# Patient Record
Sex: Female | Born: 1990 | Race: White | Hispanic: No | Marital: Single | Smoking: Former smoker
Health system: Southern US, Community
[De-identification: ages and names within clinical notes are randomized; demographics above are authoritative.]

## PROBLEM LIST (undated history)

## (undated) DIAGNOSIS — D6851 Activated protein C resistance: Secondary | ICD-10-CM

## (undated) HISTORY — DX: Activated protein C resistance: D68.51

## (undated) HISTORY — PX: MOUTH SURGERY: SHX715

---
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
## 2011-09-22 ENCOUNTER — Encounter: Payer: Self-pay | Admitting: Anesthesiology

## 2011-09-29 ENCOUNTER — Ambulatory Visit (INDEPENDENT_AMBULATORY_CARE_PROVIDER_SITE_OTHER): Payer: Managed Care, Other (non HMO) | Admitting: Gynecology

## 2011-09-29 ENCOUNTER — Encounter: Payer: Self-pay | Admitting: Gynecology

## 2011-09-29 DIAGNOSIS — N946 Dysmenorrhea, unspecified: Secondary | ICD-10-CM

## 2011-09-29 DIAGNOSIS — R635 Abnormal weight gain: Secondary | ICD-10-CM

## 2011-09-29 DIAGNOSIS — R5381 Other malaise: Secondary | ICD-10-CM

## 2011-09-29 DIAGNOSIS — N920 Excessive and frequent menstruation with regular cycle: Secondary | ICD-10-CM

## 2011-09-29 DIAGNOSIS — R5383 Other fatigue: Secondary | ICD-10-CM

## 2011-09-29 NOTE — Patient Instructions (Signed)
Will cal you if labs are abnormal. Start birth control pill on second day of cycle.

## 2011-09-29 NOTE — Progress Notes (Signed)
20 y/o GoPo presented to office with mother today concerned about being "hormonaly imbalanced". Patient was tested in past and has Leiden V Factor (heterozygous). She had been placed on a progesterone low dose pill but did not take for long due to compliance issues. She would like to get back on it. She has c/o of weight gain and hungry a lot. She does have strong family history of diabetes. We will have her come to office later this week for fasting blood work: CMET, TSH,U/A and will check CBC as well. She will be re prescribed Micronor (progesterone only) OCP to help with her dysmenorrhea and menorrhagia and mood swings. Risk/benefits pros and cons discussed.   Exam: HEENT unremarkable Lungs CTA Heart: RRR, no murmur or gallops Neck: No carotid bruits  A: Dysmenorrhea/menorrhagia/PMDD  P: As per above

## 2011-09-30 ENCOUNTER — Ambulatory Visit (INDEPENDENT_AMBULATORY_CARE_PROVIDER_SITE_OTHER): Payer: Managed Care, Other (non HMO) | Admitting: *Deleted

## 2011-09-30 DIAGNOSIS — R82998 Other abnormal findings in urine: Secondary | ICD-10-CM

## 2011-09-30 DIAGNOSIS — R5383 Other fatigue: Secondary | ICD-10-CM

## 2011-09-30 DIAGNOSIS — N920 Excessive and frequent menstruation with regular cycle: Secondary | ICD-10-CM

## 2011-09-30 DIAGNOSIS — R635 Abnormal weight gain: Secondary | ICD-10-CM

## 2011-09-30 LAB — COMPREHENSIVE METABOLIC PANEL
Albumin: 4.4 g/dL (ref 3.5–5.2)
BUN: 19 mg/dL (ref 6–23)
Calcium: 9.5 mg/dL (ref 8.4–10.5)
Chloride: 109 mEq/L (ref 96–112)
Creat: 0.82 mg/dL (ref 0.50–1.10)
Glucose, Bld: 88 mg/dL (ref 70–99)
Potassium: 4.9 mEq/L (ref 3.5–5.3)

## 2012-02-19 ENCOUNTER — Encounter: Payer: Self-pay | Admitting: Gynecology

## 2012-02-19 ENCOUNTER — Ambulatory Visit (INDEPENDENT_AMBULATORY_CARE_PROVIDER_SITE_OTHER): Payer: Managed Care, Other (non HMO) | Admitting: Gynecology

## 2012-02-19 VITALS — BP 116/72

## 2012-02-19 DIAGNOSIS — L089 Local infection of the skin and subcutaneous tissue, unspecified: Secondary | ICD-10-CM

## 2012-02-19 DIAGNOSIS — L723 Sebaceous cyst: Secondary | ICD-10-CM

## 2012-02-19 MED ORDER — FLUCONAZOLE 100 MG PO TABS: 100.0000 mg | ORAL_TABLET | Freq: Every day | ORAL | Status: AC

## 2012-02-19 MED ORDER — DICLOXACILLIN SODIUM 250 MG PO CAPS: 500.0000 mg | ORAL_CAPSULE | Freq: Three times a day (TID) | ORAL | Status: AC

## 2012-02-19 NOTE — Progress Notes (Signed)
21 year old who presented to the office today complaining of a redness and irritation on the right side of her breast. She stated that she squeeze did when he came to a head and cheesy-like material with some blood extruded. She denied any recent anterior, to the area. She switch her bra still an athletic support during this time. She denied any fever chills. She had been applying Neosporin regularly recently.  Exam: Both breasts were examined in sitting and supine position. Both breasts are symmetrical in appearance. No skin discoloration on the left breast. Right breast small area with minimal erythema and some flocculence nontender area was noted lateral to the right breast and nontender. Contralateral breast no skin discoloration. There was no nipple inversion or skin retraction on either breast. There was no supraclavicular or axillary lymphadenopathy.  Assessment/plan: Infected sebaceous cyst. Patient will be placed on dicloxacillin 500 mg one by mouth 3 times a day for 10 days. She may continue to apply the Neosporin to 3 times a day. She'll return to the office in 2 weeks for followup.

## 2012-02-19 NOTE — Patient Instructions (Signed)
Epidermal Cyst An epidermal cyst is usually a small, painless lump under the skin. Cysts often occur on the face, neck, stomach, chest, or genitals. The cyst may be filled with a bad smelling paste. Do not pop your cyst. Popping the cyst can cause pain and puffiness (swelling). HOME CARE   Only take medicines as told by your doctor.   Take your medicine (antibiotics) as told. Finish it even if you start to feel better.  GET HELP RIGHT AWAY IF:  Your cyst is tender, red, or puffy.   You are not getting better, or you are getting worse.   You have any questions or concerns.  MAKE SURE YOU:  Understand these instructions.   Will watch your condition.   Will get help right away if you are not doing well or get worse.  Document Released: 01/15/2005 Document Revised: 08/20/2011 Document Reviewed: 06/16/2011 [employer] Patient Information 2012 [employer]. 

## 2012-03-03 ENCOUNTER — Ambulatory Visit: Payer: Managed Care, Other (non HMO) | Admitting: Gynecology

## 2012-03-09 ENCOUNTER — Ambulatory Visit (INDEPENDENT_AMBULATORY_CARE_PROVIDER_SITE_OTHER): Payer: Managed Care, Other (non HMO) | Admitting: Gynecology

## 2012-03-09 ENCOUNTER — Encounter: Payer: Self-pay | Admitting: Gynecology

## 2012-03-09 VITALS — BP 100/68

## 2012-03-09 DIAGNOSIS — N6089 Other benign mammary dysplasias of unspecified breast: Secondary | ICD-10-CM

## 2012-03-09 NOTE — Progress Notes (Signed)
Patient a 21 year old who was seen in the office on February 28 and she was found to have an infected sebaceous cyst of the right breast and was placed on dicloxacillin 500 mg 3 times a day for 10 days and application of Neosporin transdermally. Patient presented to the office today for followup.  Breast exam: Both breasts were examined sitting supine position there were symmetrical in appearance no skin discoloration no nipple inversion no palpable masses or supraclavicular axillary lymphadenopathy. The previous area on the right breast at approximately the 10:00 position were the sebaceous infected cyst was present has completely healed.  Patient was reassured and instructed to return to the office in one year or when necessary and to continue to do her monthly self breast examinations.

## 2013-01-25 ENCOUNTER — Other Ambulatory Visit (HOSPITAL_COMMUNITY)
Admission: RE | Admit: 2013-01-25 | Discharge: 2013-01-25 | Disposition: A | Discharge status: Home / Self Care | Payer: Managed Care, Other (non HMO) | Source: Ambulatory Visit | Attending: Gynecology | Admitting: Gynecology

## 2013-01-25 ENCOUNTER — Encounter: Payer: Self-pay | Admitting: Gynecology

## 2013-01-25 ENCOUNTER — Ambulatory Visit (INDEPENDENT_AMBULATORY_CARE_PROVIDER_SITE_OTHER): Payer: Managed Care, Other (non HMO) | Admitting: Gynecology

## 2013-01-25 VITALS — BP 118/70 | Ht 61.5 in | Wt 142.0 lb

## 2013-01-25 DIAGNOSIS — Z01419 Encounter for gynecological examination (general) (routine) without abnormal findings: Secondary | ICD-10-CM

## 2013-01-25 DIAGNOSIS — Z833 Family history of diabetes mellitus: Secondary | ICD-10-CM

## 2013-01-25 DIAGNOSIS — D6851 Activated protein C resistance: Secondary | ICD-10-CM

## 2013-01-25 DIAGNOSIS — D6859 Other primary thrombophilia: Secondary | ICD-10-CM

## 2013-01-25 DIAGNOSIS — R635 Abnormal weight gain: Secondary | ICD-10-CM | POA: Insufficient documentation

## 2013-01-25 DIAGNOSIS — Z113 Encounter for screening for infections with a predominantly sexual mode of transmission: Secondary | ICD-10-CM

## 2013-01-25 MED ORDER — NORETHINDRONE 0.35 MG PO TABS: 1.0000 | ORAL_TABLET | Freq: Every day | ORAL | Status: DC

## 2013-01-25 NOTE — Progress Notes (Signed)
Kaylee Collins 05-06-1991 [id number]   History:    22 y.o.  for annual gyn exam had run out of her oral contraceptive pill which was progesterone only (Camilla).Patient was tested in past and has Leiden V Factor (heterozygous). She has been using condoms for contraception. She has completed previously the Gardasil Vaccine series. She frequently does her self breast examination. She was complaining of weight gain today as well. She has a steady sexual partner. Patient with family history of diabetes.   Past medical history,surgical history, family history and social history were all reviewed and documented in the EPIC chart.  Gynecologic History Patient's last menstrual period was 12/30/2012. Contraception: condoms Last Pap: First Pap smear today. Results were: First Pap smear today Last mammogram: Not indicated. Results were: Not indicated  Obstetric History OB History    Grav Para Term Preterm Abortions TAB SAB Ect Mult Living   0                ROS: A ROS was performed and pertinent positives and negatives are included in the history.  GENERAL: No fevers or chills. HEENT: No change in vision, no earache, sore throat or sinus congestion. NECK: No pain or stiffness. CARDIOVASCULAR: No chest pain or pressure. No palpitations. PULMONARY: No shortness of breath, cough or wheeze. GASTROINTESTINAL: No abdominal pain, nausea, vomiting or diarrhea, melena or bright red blood per rectum. GENITOURINARY: No urinary frequency, urgency, hesitancy or dysuria. MUSCULOSKELETAL: No joint or muscle pain, no back pain, no recent trauma. DERMATOLOGIC: No rash, no itching, no lesions. ENDOCRINE: No polyuria, polydipsia, no heat or cold intolerance. No recent change in weight. HEMATOLOGICAL: No anemia or easy bruising or bleeding. NEUROLOGIC: No headache, seizures, numbness, tingling or weakness. PSYCHIATRIC: No depression, no loss of interest in normal activity or change in sleep pattern.     Exam: chaperone  present  BP 118/70  Ht 5' 1.5" (1.562 m)  Wt 142 lb (64.411 kg)  BMI 26.40 kg/m2  LMP 12/30/2012  Body mass index is 26.40 kg/(m^2).  General appearance : Well developed well nourished female. No acute distress HEENT: Neck supple, trachea midline, no carotid bruits, no thyroidmegaly Lungs: Clear to auscultation, no rhonchi or wheezes, or rib retractions  Heart: Regular rate and rhythm, no murmurs or gallops Breast:Examined in sitting and supine position were symmetrical in appearance, no palpable masses or tenderness,  no skin retraction, no nipple inversion, no nipple discharge, no skin discoloration, no axillary or supraclavicular lymphadenopathy Abdomen: no palpable masses or tenderness, no rebound or guarding Extremities: no edema or skin discoloration or tenderness  Pelvic:  Bartholin, Urethra, Skene Glands: Within normal limits             Vagina: No gross lesions or discharge  Cervix: No gross lesions or discharge  Uterus  anteverted, normal size, shape and consistency, non-tender and mobile  Adnexa  Without masses or tenderness  Anus and perineum  normal   Rectovaginal  normal sphincter tone without palpated masses or tenderness             Hemoccult not indicated   GC and Chlamydia culture obtained today  Assessment/Plan:  22 y.o. female for annual exam requesting refill on her Camilla oral contraceptive pill. Patient fully understands the risks benefits and pros and cons of oral contraceptive pills. She is heterozygous for factor V 5 Leiden. She was reminded to do her monthly self breast examination. The following labs were drawn today: CBC, urinalysis, TSH, hemoglobin A1c, and Pap  smear.     Ok Edwards MD, 5:15 PM 01/25/2013

## 2013-01-25 NOTE — Patient Instructions (Addendum)
Breast Self-Awareness  Practicing breast self-awareness may pick up problems early, prevent significant medical complications, and possibly save your life. By practicing breast self-awareness, you can become familiar with how your breasts look and feel and if your breasts are changing. This allows you to notice changes early. It can also offer you some reassurance that your breast health is good. One way to learn what is normal for your breasts and whether your breasts are changing is to do a breast self-exam.  If you find a lump or something that was not present in the past, it is best to contact your caregiver right away. Other findings that should be evaluated by your caregiver include nipple discharge, especially if it is bloody; skin changes or reddening; areas where the skin seems to be pulled in (retracted); or new lumps and bumps. Breast pain is seldom associated with cancer (malignancy), but should also be evaluated by a caregiver.  BREAST SELF-EXAM  The best time to examine your breasts is 5 7 days after your menstrual period is over. During menstruation, the breasts are lumpier, and it may be more difficult to pick up changes. If you do not menstruate, have reached menopause, or had your uterus removed (hysterectomy), you should examine your breasts at regular intervals, such as monthly. If you are breastfeeding, examine your breasts after a feeding or after using a breast pump. Breast implants do not decrease the risk for lumps or tumors, so continue to perform breast self-exams as recommended. Talk to your caregiver about how to determine the difference between the implant and breast tissue. Also, talk about the amount of pressure you should use during the exam. Over time, you will become more familiar with the variations of your breasts and more comfortable with the exam. A breast self-exam requires you to remove all your clothes above the waist.    Look at your breasts and nipples. Stand in front of  a mirror in a room with good lighting. With your hands on your hips, push your hands firmly downward. Look for a difference in shape, contour, and size from one breast to the other (asymmetry). Asymmetry includes puckers, dips, or bumps. Also, look for skin changes, such as reddened or scaly areas on the breasts. Look for nipple changes, such as discharge, dimpling, repositioning, or redness.   Carefully feel your breasts. This is best done either in the shower or tub while using soapy water or when flat on your back. Place the arm (on the side of the breast you are examining) above your head. Use the pads (not the fingertips) of your three middle fingers on your opposite hand to feel your breasts. Start in the underarm area and use  inch (2 cm) overlapping circles to feel your breast. Use 3 different levels of pressure (light, medium, and firm pressure) at each circle before moving to the next circle. The light pressure is needed to feel the tissue closest to the skin. The medium pressure will help to feel breast tissue a little deeper, while the firm pressure is needed to feel the tissue close to the ribs. Continue the overlapping circles, moving downward over the breast until you feel your ribs below your breast. Then, move one finger-width towards the center of the body. Continue to use the  inch (2 cm) overlapping circles to feel your breast as you move slowly up toward the collar bone (clavicle) near the base of the neck. Continue the up and down exam using all 3 pressures   the chest. Do this with each breast, carefully feeling for lumps or changes.  Keep a written record with breast changes or normal findings for each breast. By writing this information down, you do not need to depend only on memory for size, tenderness, or location. Write down where you are in your menstrual cycle, if you are still menstruating.  Breast tissue can have some lumps or thick tissue. However,  see your caregiver if you find anything that concerns you.  SEEK MEDICAL CARE IF:  You see a change in shape, contour, or size of your breasts or nipples.   You see skin changes, such as reddened or scaly areas on the breasts or nipples.   You have an unusual discharge from your nipples.   You feel a new lump or unusually thick areas.  Document Released: 12/08/2005 Document Revised: 06/08/2012 Document Reviewed: 03/24/2012 [facility] Patient Information 2013 [place], [place].  Intrauterine Device Information An intrauterine device (IUD) is inserted into your uterus and prevents pregnancy. There are 2 types of IUDs available:  Copper IUD. This type of IUD is wrapped in copper wire and is placed inside the uterus. Copper makes the uterus and fallopian tubes produce a fluid that kills sperm. The copper IUD can stay in place for 10 years.  Hormone IUD. This type of IUD contains the hormone progestin (synthetic progesterone). The hormone thickens the cervical mucus and prevents sperm from entering the uterus, and it also thins the uterine lining to prevent implantation of a fertilized egg. The hormone can weaken or kill the sperm that get into the uterus. The hormone IUD can stay in place for 5 years. Your caregiver will make sure you are a good candidate for a contraceptive IUD. Discuss with your caregiver the possible side effects. ADVANTAGES  It is highly effective, reversible, long-acting, and low maintenance.  There are no estrogen-related side effects.  An IUD can be used when breastfeeding.  It is not associated with weight gain.  It works immediately after insertion.  The copper IUD does not interfere with your female hormones.  The progesterone IUD can make heavy menstrual periods lighter.  The progesterone IUD can be used for 5 years.  The copper IUD can be used for 10 years. DISADVANTAGES  The progesterone IUD can be associated with irregular bleeding patterns.  The  copper IUD can make your menstrual flow heavier and more painful.  You may experience cramping and vaginal bleeding after insertion. Document Released: 11/11/2004 Document Revised: 03/01/2012 Document Reviewed: 04/12/2011 [facility] Patient Information 2013 [place], [place].

## 2013-01-26 ENCOUNTER — Other Ambulatory Visit: Payer: Managed Care, Other (non HMO)

## 2013-01-26 LAB — GC/CHLAMYDIA PROBE AMP
CT Probe RNA: NEGATIVE
GC Probe RNA: NEGATIVE

## 2013-01-27 ENCOUNTER — Encounter: Payer: Self-pay | Admitting: Gynecology

## 2013-01-27 LAB — URINALYSIS W MICROSCOPIC + REFLEX CULTURE
Bilirubin Urine: NEGATIVE
Casts: NONE SEEN
Crystals: NONE SEEN
Glucose, UA: NEGATIVE mg/dL
Hgb urine dipstick: NEGATIVE
Ketones, ur: NEGATIVE mg/dL
Leukocytes, UA: NEGATIVE
Protein, ur: NEGATIVE mg/dL
Specific Gravity, Urine: 1.025 (ref 1.005–1.030)
Squamous Epithelial / LPF: NONE SEEN
pH: 7.5 (ref 5.0–8.0)

## 2013-01-27 LAB — CBC WITH DIFFERENTIAL/PLATELET
Eosinophils Absolute: 0.1 10*3/uL (ref 0.0–0.7)
HCT: 37.8 % (ref 36.0–46.0)
Hemoglobin: 13.1 g/dL (ref 12.0–15.0)
Lymphocytes Relative: 49 % — ABNORMAL HIGH (ref 12–46)
Lymphs Abs: 3.4 10*3/uL (ref 0.7–4.0)
Monocytes Absolute: 0.3 10*3/uL (ref 0.1–1.0)
Monocytes Relative: 4 % (ref 3–12)
Neutro Abs: 3.1 10*3/uL (ref 1.7–7.7)
Neutrophils Relative %: 44 % (ref 43–77)
RBC: 4.34 MIL/uL (ref 3.87–5.11)
WBC: 6.9 10*3/uL (ref 4.0–10.5)

## 2013-01-27 LAB — HEMOGLOBIN A1C
Hgb A1c MFr Bld: 5.5 % (ref <5.7)
Mean Plasma Glucose: 111 mg/dL (ref <117)

## 2013-07-20 ENCOUNTER — Other Ambulatory Visit: Payer: Self-pay | Admitting: Family Medicine

## 2013-07-20 DIAGNOSIS — R109 Unspecified abdominal pain: Secondary | ICD-10-CM

## 2013-07-21 ENCOUNTER — Ambulatory Visit
Admission: RE | Admit: 2013-07-21 | Discharge: 2013-07-21 | Disposition: A | Discharge status: Home / Self Care | Payer: Managed Care, Other (non HMO) | Source: Ambulatory Visit | Attending: Family Medicine | Admitting: Family Medicine

## 2013-07-21 DIAGNOSIS — R109 Unspecified abdominal pain: Secondary | ICD-10-CM

## 2013-08-03 ENCOUNTER — Other Ambulatory Visit: Payer: Self-pay | Admitting: Gastroenterology

## 2013-08-03 DIAGNOSIS — R109 Unspecified abdominal pain: Secondary | ICD-10-CM

## 2013-08-03 DIAGNOSIS — R197 Diarrhea, unspecified: Secondary | ICD-10-CM

## 2013-08-11 ENCOUNTER — Ambulatory Visit
Admission: RE | Admit: 2013-08-11 | Discharge: 2013-08-11 | Disposition: A | Discharge status: Home / Self Care | Payer: Managed Care, Other (non HMO) | Source: Ambulatory Visit | Attending: Gastroenterology | Admitting: Gastroenterology

## 2013-08-11 ENCOUNTER — Other Ambulatory Visit: Payer: Self-pay | Admitting: Gastroenterology

## 2013-08-11 DIAGNOSIS — R197 Diarrhea, unspecified: Secondary | ICD-10-CM

## 2013-08-11 DIAGNOSIS — R109 Unspecified abdominal pain: Secondary | ICD-10-CM

## 2014-11-15 ENCOUNTER — Ambulatory Visit (INDEPENDENT_AMBULATORY_CARE_PROVIDER_SITE_OTHER): Payer: BC Managed Care – PPO | Admitting: Gynecology

## 2014-11-15 ENCOUNTER — Encounter: Payer: Self-pay | Admitting: Gynecology

## 2014-11-15 VITALS — BP 112/76 | Ht 61.5 in | Wt 177.0 lb

## 2014-11-15 DIAGNOSIS — R635 Abnormal weight gain: Secondary | ICD-10-CM

## 2014-11-15 DIAGNOSIS — D688 Other specified coagulation defects: Secondary | ICD-10-CM

## 2014-11-15 DIAGNOSIS — Z01419 Encounter for gynecological examination (general) (routine) without abnormal findings: Secondary | ICD-10-CM

## 2014-11-15 DIAGNOSIS — N946 Dysmenorrhea, unspecified: Secondary | ICD-10-CM | POA: Insufficient documentation

## 2014-11-15 DIAGNOSIS — N920 Excessive and frequent menstruation with regular cycle: Secondary | ICD-10-CM

## 2014-11-15 DIAGNOSIS — Z113 Encounter for screening for infections with a predominantly sexual mode of transmission: Secondary | ICD-10-CM

## 2014-11-15 DIAGNOSIS — D6851 Activated protein C resistance: Secondary | ICD-10-CM

## 2014-11-15 DIAGNOSIS — Z833 Family history of diabetes mellitus: Secondary | ICD-10-CM

## 2014-11-15 NOTE — Progress Notes (Signed)
Kaylee Collins 10/28/1991 [id number]   History:    23 y.o.  for annual gyn exam wanted discuss alternative forms of contraception. Patient is on a progesterone only oral contraceptive pill (Camilla) ).Patient was tested in past and has Leiden V Factor (heterozygous). She has been using condoms for contraception at times since she finished her oral contraceptive pill. She is suffering from heavy cycles and cramping and would like to look at an alternative such as an IUD. She also has gaining weight as well. She was weighing 142 pounds last year she is up to 177 pounds. She does have family history of diabetes. Patient declined flu vaccine.  Past medical history,surgical history, family history and social history were all reviewed and documented in the EPIC chart.  Gynecologic History Patient's last menstrual period was 10/25/2014. Contraception: none Last Pap: 2014. Results were: normal Last mammogram: Not indicated. Results were: Not indicated  Obstetric History OB History  Gravida Para Term Preterm AB SAB TAB Ectopic Multiple Living  0                  ROS: A ROS was performed and pertinent positives and negatives are included in the history.  GENERAL: No fevers or chills. HEENT: No change in vision, no earache, sore throat or sinus congestion. NECK: No pain or stiffness. CARDIOVASCULAR: No chest pain or pressure. No palpitations. PULMONARY: No shortness of breath, cough or wheeze. GASTROINTESTINAL: No abdominal pain, nausea, vomiting or diarrhea, melena or bright red blood per rectum. GENITOURINARY: No urinary frequency, urgency, hesitancy or dysuria. MUSCULOSKELETAL: No joint or muscle pain, no back pain, no recent trauma. DERMATOLOGIC: No rash, no itching, no lesions. ENDOCRINE: No polyuria, polydipsia, no heat or cold intolerance. No recent change in weight. HEMATOLOGICAL: No anemia or easy bruising or bleeding. NEUROLOGIC: No headache, seizures, numbness, tingling or weakness.  PSYCHIATRIC: No depression, no loss of interest in normal activity or change in sleep pattern.     Exam: chaperone present  BP 112/76 mmHg  Ht 5' 1.5" (1.562 m)  Wt 177 lb (80.287 kg)  BMI 32.91 kg/m2  LMP 10/25/2014  Body mass index is 32.91 kg/(m^2).  General appearance : Well developed well nourished female. No acute distress HEENT: Neck supple, trachea midline, no carotid bruits, no thyroidmegaly Lungs: Clear to auscultation, no rhonchi or wheezes, or rib retractions  Heart: Regular rate and rhythm, no murmurs or gallops Breast:Examined in sitting and supine position were symmetrical in appearance, no palpable masses or tenderness,  no skin retraction, no nipple inversion, no nipple discharge, no skin discoloration, no axillary or supraclavicular lymphadenopathy Abdomen: no palpable masses or tenderness, no rebound or guarding Extremities: no edema or skin discoloration or tenderness  Pelvic:  Bartholin, Urethra, Skene Glands: Within normal limits             Vagina: No gross lesions or discharge  Cervix: No gross lesions or discharge  Uterus  anteverted, normal size, shape and consistency, non-tender and mobile  Adnexa  Without masses or tenderness  Anus and perineum  normal   Rectovaginal  normal sphincter tone without palpated masses or tenderness             Hemoccult not indicated     Assessment/Plan:  23 y.o. female for annual exam who has history of Leiden V Factor (heterozygous) wishing to change her form of contraception. She would be an ideal candidate for the Mirena IUD. Literature information was provided. She will contact the office when  her cycle start so that we can place one for her. When she makes her appointment she will do in the morning so she will come in a fasting states we can do the following blood work: Fasting lipid profile, competence metabolic panel, thyroid function test, CBC, and urinalysis. Pap smear not done today in accordance to the new  guidelines. Healthy lifestyle, nutrition, and exercise were discussed.   Ok EdwardsFERNANDEZ,Josemaria Brining H MD, 3:42 PM 11/15/2014

## 2014-11-15 NOTE — Patient Instructions (Signed)
Levonorgestrel intrauterine device (IUD) What is this medicine? LEVONORGESTREL IUD (LEE voe nor jes trel) is a contraceptive (birth control) device. The device is placed inside the uterus by a healthcare professional. It is used to prevent pregnancy and can also be used to treat heavy bleeding that occurs during your period. Depending on the device, it can be used for 3 to 5 years. This medicine may be used for other purposes; ask your health care provider or pharmacist if you have questions. COMMON BRAND NAME(S): LILETTA, Mirena, Skyla What should I tell my health care provider before I take this medicine? They need to know if you have any of these conditions: -abnormal Pap smear -cancer of the breast, uterus, or cervix -diabetes -endometritis -genital or pelvic infection now or in the past -have more than one sexual partner or your partner has more than one partner -heart disease -history of an ectopic or tubal pregnancy -immune system problems -IUD in place -liver disease or tumor -problems with blood clots or take blood-thinners -use intravenous drugs -uterus of unusual shape -vaginal bleeding that has not been explained -an unusual or allergic reaction to levonorgestrel, other hormones, silicone, or polyethylene, medicines, foods, dyes, or preservatives -pregnant or trying to get pregnant -breast-feeding How should I use this medicine? This device is placed inside the uterus by a health care professional. Talk to your pediatrician regarding the use of this medicine in children. Special care may be needed. Overdosage: If you think you have taken too much of this medicine contact a poison control center or emergency room at once. NOTE: This medicine is only for you. Do not share this medicine with others. What if I miss a dose? This does not apply. What may interact with this medicine? Do not take this medicine with any of the following  medications: -amprenavir -bosentan -fosamprenavir This medicine may also interact with the following medications: -aprepitant -barbiturate medicines for inducing sleep or treating seizures -bexarotene -griseofulvin -medicines to treat seizures like carbamazepine, ethotoin, felbamate, oxcarbazepine, phenytoin, topiramate -modafinil -pioglitazone -rifabutin -rifampin -rifapentine -some medicines to treat HIV infection like atazanavir, indinavir, lopinavir, nelfinavir, tipranavir, ritonavir -St. John's wort -warfarin This list may not describe all possible interactions. Give your health care provider a list of all the medicines, herbs, non-prescription drugs, or dietary supplements you use. Also tell them if you smoke, drink alcohol, or use illegal drugs. Some items may interact with your medicine. What should I watch for while using this medicine? Visit your doctor or health care professional for regular check ups. See your doctor if you or your partner has sexual contact with others, becomes HIV positive, or gets a sexual transmitted disease. This product does not protect you against HIV infection (AIDS) or other sexually transmitted diseases. You can check the placement of the IUD yourself by reaching up to the top of your vagina with clean fingers to feel the threads. Do not pull on the threads. It is a good habit to check placement after each menstrual period. Call your doctor right away if you feel more of the IUD than just the threads or if you cannot feel the threads at all. The IUD may come out by itself. You may become pregnant if the device comes out. If you notice that the IUD has come out use a backup birth control method like condoms and call your health care provider. Using tampons will not change the position of the IUD and are okay to use during your period. What side effects may   I notice from receiving this medicine? Side effects that you should report to your doctor or  health care professional as soon as possible: -allergic reactions like skin rash, itching or hives, swelling of the face, lips, or tongue -fever, flu-like symptoms -genital sores -high blood pressure -no menstrual period for 6 weeks during use -pain, swelling, warmth in the leg -pelvic pain or tenderness -severe or sudden headache -signs of pregnancy -stomach cramping -sudden shortness of breath -trouble with balance, talking, or walking -unusual vaginal bleeding, discharge -yellowing of the eyes or skin Side effects that usually do not require medical attention (report to your doctor or health care professional if they continue or are bothersome): -acne -breast pain -change in sex drive or performance -changes in weight -cramping, dizziness, or faintness while the device is being inserted -headache -irregular menstrual bleeding within first 3 to 6 months of use -nausea This list may not describe all possible side effects. Call your doctor for medical advice about side effects. You may report side effects to FDA at 1-800-FDA-1088. Where should I keep my medicine? This does not apply. NOTE: This sheet is a summary. It may not cover all possible information. If you have questions about this medicine, talk to your doctor, pharmacist, or health care provider.  2015, [employer]. (2012-01-08 13:54:04)  

## 2014-11-16 LAB — URINALYSIS W MICROSCOPIC + REFLEX CULTURE
Bacteria, UA: NONE SEEN
Bilirubin Urine: NEGATIVE
CRYSTALS: NONE SEEN
Casts: NONE SEEN
GLUCOSE, UA: NEGATIVE mg/dL
Hgb urine dipstick: NEGATIVE
Ketones, ur: NEGATIVE mg/dL
LEUKOCYTES UA: NEGATIVE
Nitrite: NEGATIVE
PROTEIN: NEGATIVE mg/dL
SPECIFIC GRAVITY, URINE: 1.028 (ref 1.005–1.030)
SQUAMOUS EPITHELIAL / LPF: NONE SEEN
UROBILINOGEN UA: 0.2 mg/dL (ref 0.0–1.0)
pH: 5.5 (ref 5.0–8.0)

## 2014-11-16 LAB — GC/CHLAMYDIA PROBE AMP
CT PROBE, AMP APTIMA: NEGATIVE
GC PROBE AMP APTIMA: NEGATIVE

## 2014-11-20 ENCOUNTER — Telehealth: Payer: Self-pay | Admitting: Gynecology

## 2014-11-20 NOTE — Telephone Encounter (Signed)
11/20/14-I LM VM for pt [facility] Mirena IUD & insertion for contraception at 100%,no copay or deductible. She will let us know if she wants to proceed. Advised need to schedule while on cycle.wl

## 2014-11-21 ENCOUNTER — Other Ambulatory Visit: Payer: Self-pay | Admitting: Gynecology

## 2014-11-21 DIAGNOSIS — Z30431 Encounter for routine checking of intrauterine contraceptive device: Secondary | ICD-10-CM

## 2014-11-21 MED ORDER — LEVONORGESTREL 20 MCG/24HR IU IUD: INTRAUTERINE_SYSTEM | Freq: Once | INTRAUTERINE | Status: DC

## 2014-11-22 ENCOUNTER — Other Ambulatory Visit: Payer: BC Managed Care – PPO

## 2014-11-22 ENCOUNTER — Ambulatory Visit (INDEPENDENT_AMBULATORY_CARE_PROVIDER_SITE_OTHER): Payer: BC Managed Care – PPO | Admitting: Gynecology

## 2014-11-22 ENCOUNTER — Encounter: Payer: Self-pay | Admitting: Gynecology

## 2014-11-22 VITALS — BP 122/88

## 2014-11-22 DIAGNOSIS — Z30430 Encounter for insertion of intrauterine contraceptive device: Secondary | ICD-10-CM

## 2014-11-22 DIAGNOSIS — Z975 Presence of (intrauterine) contraceptive device: Secondary | ICD-10-CM

## 2014-11-22 LAB — CBC WITH DIFFERENTIAL/PLATELET
Basophils Absolute: 0.1 10*3/uL (ref 0.0–0.1)
Basophils Relative: 1 % (ref 0–1)
EOS ABS: 0.1 10*3/uL (ref 0.0–0.7)
EOS PCT: 1 % (ref 0–5)
HEMATOCRIT: 38.6 % (ref 36.0–46.0)
HEMOGLOBIN: 13.4 g/dL (ref 12.0–15.0)
LYMPHS ABS: 2.3 10*3/uL (ref 0.7–4.0)
LYMPHS PCT: 39 % (ref 12–46)
MCH: 29.2 pg (ref 26.0–34.0)
MCHC: 34.7 g/dL (ref 30.0–36.0)
MCV: 84.1 fL (ref 78.0–100.0)
MONO ABS: 0.4 10*3/uL (ref 0.1–1.0)
MONOS PCT: 7 % (ref 3–12)
MPV: 9.9 fL (ref 9.4–12.4)
Neutro Abs: 3 10*3/uL (ref 1.7–7.7)
Neutrophils Relative %: 52 % (ref 43–77)
Platelets: 265 10*3/uL (ref 150–400)
RBC: 4.59 MIL/uL (ref 3.87–5.11)
RDW: 12.3 % (ref 11.5–15.5)
WBC: 5.8 10*3/uL (ref 4.0–10.5)

## 2014-11-22 LAB — HIV ANTIBODY (ROUTINE TESTING W REFLEX): HIV 1&2 Ab, 4th Generation: NONREACTIVE

## 2014-11-22 LAB — THYROID PANEL WITH TSH
FREE THYROXINE INDEX: 2 (ref 1.4–3.8)
T3 UPTAKE: 29 % (ref 22–35)
T4, Total: 6.8 ug/dL (ref 4.5–12.0)
TSH: 1.512 u[IU]/mL (ref 0.350–4.500)

## 2014-11-22 LAB — COMPREHENSIVE METABOLIC PANEL
ALT: 12 U/L (ref 0–35)
AST: 15 U/L (ref 0–37)
Albumin: 3.9 g/dL (ref 3.5–5.2)
Alkaline Phosphatase: 58 U/L (ref 39–117)
BILIRUBIN TOTAL: 0.5 mg/dL (ref 0.2–1.2)
BUN: 16 mg/dL (ref 6–23)
CALCIUM: 9.1 mg/dL (ref 8.4–10.5)
CHLORIDE: 107 meq/L (ref 96–112)
CO2: 25 meq/L (ref 19–32)
CREATININE: 0.7 mg/dL (ref 0.50–1.10)
GLUCOSE: 97 mg/dL (ref 70–99)
Potassium: 4.2 mEq/L (ref 3.5–5.3)
Sodium: 136 mEq/L (ref 135–145)
Total Protein: 6.8 g/dL (ref 6.0–8.3)

## 2014-11-22 LAB — LIPID PANEL
CHOL/HDL RATIO: 3.3 ratio
CHOLESTEROL: 145 mg/dL (ref 0–200)
HDL: 44 mg/dL (ref >39)
LDL Cholesterol: 86 mg/dL (ref 0–99)
TRIGLYCERIDES: 73 mg/dL (ref <150)
VLDL: 15 mg/dL (ref 0–40)

## 2014-11-22 NOTE — Progress Notes (Signed)
    23 year old who presented to the office today for placement of Mirena IUD. She was seen in the office for her annual exam on November 25. Patient had been on a progesterone only oral contraceptive pill (Camilla) ).Patient was tested in past and has Leiden V Factor (heterozygous). She has been using condoms for contraception at times since she finished her oral contraceptive pill. She is suffering from heavy cycles and cramping this the reason for selecting the Mirena IUD for contraception and cycle control.                                                                     IUD procedure note       Patient presented to the office today for placement of Mirena IUD. The patient had previously been provided with literature information on this method of contraception. The risks benefits and pros and cons were discussed and all her questions were answered. She is fully aware that this form of contraception is 99% effective and is good for 5 years.  Pelvic exam: Bartholin urethra Skene glands: Within normal limits Vagina: No lesions or discharge, menstrual blood Cervix: No lesions or discharge Uterus: Anteverted position Adnexa: No masses or tenderness Rectal exam: Not done  The cervix was cleansed with Betadine solution. A single-tooth tenaculum was placed on the anterior cervical lip. The uterus sounded to 7 centimeter. The IUD was shown to the patient and inserted in a sterile fashion. The IUD string was trimmed. The single-tooth tenaculum was removed. Patient was instructed to return back to the office in one month for follow up.       Lot ZO:[id number]

## 2014-11-22 NOTE — Patient Instructions (Signed)

## 2014-11-23 ENCOUNTER — Encounter: Payer: Self-pay | Admitting: Gynecology

## 2014-12-27 ENCOUNTER — Ambulatory Visit: Payer: BC Managed Care – PPO | Admitting: Gynecology

## 2015-01-03 ENCOUNTER — Ambulatory Visit (INDEPENDENT_AMBULATORY_CARE_PROVIDER_SITE_OTHER): Payer: BLUE CROSS/BLUE SHIELD | Admitting: Gynecology

## 2015-01-03 ENCOUNTER — Ambulatory Visit (INDEPENDENT_AMBULATORY_CARE_PROVIDER_SITE_OTHER): Payer: BLUE CROSS/BLUE SHIELD

## 2015-01-03 ENCOUNTER — Other Ambulatory Visit: Payer: Self-pay | Admitting: Gynecology

## 2015-01-03 ENCOUNTER — Encounter: Payer: Self-pay | Admitting: Gynecology

## 2015-01-03 VITALS — BP 120/78

## 2015-01-03 DIAGNOSIS — N83201 Unspecified ovarian cyst, right side: Secondary | ICD-10-CM

## 2015-01-03 DIAGNOSIS — Z30431 Encounter for routine checking of intrauterine contraceptive device: Secondary | ICD-10-CM

## 2015-01-03 DIAGNOSIS — N8320 Unspecified ovarian cysts: Secondary | ICD-10-CM

## 2015-01-03 NOTE — Progress Notes (Signed)
.    Patient presented to the office for her 1 month follow-up after having placed the Mirena IUD.She was seen in the office for her annual exam on November 25. Patient had been on a progesterone only oral contraceptive pill (Camilla) ).Patient was tested in past and has Leiden V Factor (heterozygous. Patient experiencing cramping and spotting for the past month and now has resolved.  Exam: Abdomen: Soft nontender no rebound or guarding Pelvic: Bartholin urethra Skene was within normal limits Vagina: No lesions or discharge Cervix: IUD string not visualized, endocervical speculum colposcoped brought into view could not see the IUD string so an ultrasound was ordered. Uterus: Anteverted normal size shape and consistency nontender Adnexa: No palpable mass or tenderness rectal exam not done  Ultrasound today:  Uterus measured 8.2 x 5.4 x 3.8 cm endometrial stripe of 8.0 mm. IUD was found to be in the proper position. Right ovary thinwall echo-free simple cyst measuring 2.9 x 3.1 x 2.3 cm average size 2.8 cm with negative color flow. Left ovary normal. No fluid in the cul-de-sac.  Assessment for/plan: One month status post placement Mirena IUD not visualized during pelvic exam. Ultrasound confirmed proper position of the IUD. Incidental finding of a simple echo-free right ovarian cyst was noted. She scheduled to return back in 3 months for annual exam will do an ultrasound for follow-up on this simple ovarian cyst. Patient was reassured and literature information was provided.

## 2015-01-03 NOTE — Patient Instructions (Signed)
Ovarian Cyst An ovarian cyst is a fluid-filled sac that forms on an ovary. The ovaries are small organs that produce eggs in women. Various types of cysts can form on the ovaries. Most are not cancerous. Many do not cause problems, and they often go away on their own. Some may cause symptoms and require treatment. Common types of ovarian cysts include:  Functional cysts--These cysts may occur every month during the menstrual cycle. This is normal. The cysts usually go away with the next menstrual cycle if the woman does not get pregnant. Usually, there are no symptoms with a functional cyst.  Endometrioma cysts--These cysts form from the tissue that lines the uterus. They are also called "chocolate cysts" because they become filled with blood that turns brown. This type of cyst can cause pain in the lower abdomen during intercourse and with your menstrual period.  Cystadenoma cysts--This type develops from the cells on the outside of the ovary. These cysts can get very big and cause lower abdomen pain and pain with intercourse. This type of cyst can twist on itself, cut off its blood supply, and cause severe pain. It can also easily rupture and cause a lot of pain.  Dermoid cysts--This type of cyst is sometimes found in both ovaries. These cysts may contain different kinds of body tissue, such as skin, teeth, hair, or cartilage. They usually do not cause symptoms unless they get very big.  Theca lutein cysts--These cysts occur when too much of a certain hormone (human chorionic gonadotropin) is produced and overstimulates the ovaries to produce an egg. This is most common after procedures used to assist with the conception of a baby (in vitro fertilization). CAUSES   Fertility drugs can cause a condition in which multiple large cysts are formed on the ovaries. This is called ovarian hyperstimulation syndrome.  A condition called polycystic ovary syndrome can cause hormonal imbalances that can lead to  nonfunctional ovarian cysts. SIGNS AND SYMPTOMS  Many ovarian cysts do not cause symptoms. If symptoms are present, they may include:  Pelvic pain or pressure.  Pain in the lower abdomen.  Pain during sexual intercourse.  Increasing girth (swelling) of the abdomen.  Abnormal menstrual periods.  Increasing pain with menstrual periods.  Stopping having menstrual periods without being pregnant. DIAGNOSIS  These cysts are commonly found during a routine or annual pelvic exam. Tests may be ordered to find out more about the cyst. These tests may include:  Ultrasound.  X-Dame of the pelvis.  CT scan.  MRI.  Blood tests. TREATMENT  Many ovarian cysts go away on their own without treatment. Your health care provider may want to check your cyst regularly for 2-3 months to see if it changes. For women in menopause, it is particularly important to monitor a cyst closely because of the higher rate of ovarian cancer in menopausal women. When treatment is needed, it may include any of the following:  A procedure to drain the cyst (aspiration). This may be done using a long needle and ultrasound. It can also be done through a laparoscopic procedure. This involves using a thin, lighted tube with a tiny camera on the end (laparoscope) inserted through a small incision.  Surgery to remove the whole cyst. This may be done using laparoscopic surgery or an open surgery involving a larger incision in the lower abdomen.  Hormone treatment or birth control pills. These methods are sometimes used to help dissolve a cyst. HOME CARE INSTRUCTIONS   Only take over-the-counter   or prescription medicines as directed by your health care provider.  Follow up with your health care provider as directed.  Get regular pelvic exams and Pap tests. SEEK MEDICAL CARE IF:   Your periods are late, irregular, or painful, or they stop.  Your pelvic pain or abdominal pain does not go away.  Your abdomen becomes  larger or swollen.  You have pressure on your bladder or trouble emptying your bladder completely.  You have pain during sexual intercourse.  You have feelings of fullness, pressure, or discomfort in your stomach.  You lose weight for no apparent reason.  You feel generally ill.  You become constipated.  You lose your appetite.  You develop acne.  You have an increase in body and facial hair.  You are gaining weight, without changing your exercise and eating habits.  You think you are pregnant. SEEK IMMEDIATE MEDICAL CARE IF:   You have increasing abdominal pain.  You feel sick to your stomach (nauseous), and you throw up (vomit).  You develop a fever that comes on suddenly.  You have abdominal pain during a bowel movement.  Your menstrual periods become heavier than usual. MAKE SURE YOU:  Understand these instructions.  Will watch your condition.  Will get help right away if you are not doing well or get worse. Document Released: 12/08/2005 Document Revised: 12/13/2013 Document Reviewed: 08/15/2013 ExitCare Patient Information 2015 [employer]. This information is not intended to replace advice given to you by your health care provider. Make sure you discuss any questions you have with your health care provider.  

## 2015-02-21 ENCOUNTER — Ambulatory Visit (INDEPENDENT_AMBULATORY_CARE_PROVIDER_SITE_OTHER): Payer: BLUE CROSS/BLUE SHIELD | Admitting: Gynecology

## 2015-02-21 ENCOUNTER — Encounter: Payer: Self-pay | Admitting: Gynecology

## 2015-02-21 ENCOUNTER — Other Ambulatory Visit: Payer: Self-pay | Admitting: Gynecology

## 2015-02-21 ENCOUNTER — Telehealth: Payer: Self-pay | Admitting: Gynecology

## 2015-02-21 VITALS — BP 110/76

## 2015-02-21 DIAGNOSIS — R4586 Emotional lability: Secondary | ICD-10-CM

## 2015-02-21 DIAGNOSIS — R635 Abnormal weight gain: Secondary | ICD-10-CM | POA: Diagnosis not present

## 2015-02-21 DIAGNOSIS — Z30431 Encounter for routine checking of intrauterine contraceptive device: Secondary | ICD-10-CM

## 2015-02-21 DIAGNOSIS — F39 Unspecified mood [affective] disorder: Secondary | ICD-10-CM

## 2015-02-21 DIAGNOSIS — G44039 Episodic paroxysmal hemicrania, not intractable: Secondary | ICD-10-CM

## 2015-02-21 MED ORDER — SUMATRIPTAN SUCCINATE 50 MG PO TABS: ORAL_TABLET | ORAL | Status: DC

## 2015-02-21 NOTE — Progress Notes (Signed)
   Patient is a 24 year old who on 11/22/2014 had a Mirena IUD placed. Patient has been complaining of headaches and irregular vaginal bleeding since it was placed. She also feels bloated at times as well as mood swings.Patient had been on a progesterone only oral contraceptive pill (Camilla) ).Patient was tested in past and has Leiden V Factor (heterozygous). She was on the Mirena IUD for cycle control as well as for contraception. She states that she would rather have her heavy periods then go through this irregular bleeding and headaches that she has been experiencing since the placement of the Mirena IUD.  Exam: Blood pressure 110/76 Gen. appearance: Well-developed overweight white female with the above complaints and no current acute distress. She is having no vaginal bleeding today. She states that this is the first week that she has not had the irregular bleeding. Abdomen: Soft nontender no rebound or guarding Pelvic: Bartholin urethra Skene was within normal limits Vagina: No lesions or discharge Cervix: No lesions or discharge IUD string not seen Uterus: Anteverted normal size shape and consistency Adnexa: No palpable masses or tenderness Rectal exam not done  Assessment/plan: Patient experiencing side effects from Mirena IUD to include the following: Mood swings, headaches, weight gain and irregular bleeding. I have recommended that she return next week and replace the hormonal Mirena IUD for a nonhormonal ParaGard T380A IUD to alleviate his symptoms and still have contraceptive benefit. I've given her prescription for Imitrex 50 mg to take 1 tablet at the onset of a headache in may repeat a second tablet in 2 hours not to exceed 2 tablets in 24 hours. Literature information on the ParaGard T380A IUD was provided.

## 2015-02-21 NOTE — Patient Instructions (Signed)
Sumatriptan tablets What is this medicine? SUMATRIPTAN (soo ma TRIP tan) is used to treat migraines with or without aura. An aura is a strange feeling or visual disturbance that warns you of an attack. It is not used to prevent migraines. This medicine may be used for other purposes; ask your health care provider or pharmacist if you have questions. COMMON BRAND NAME(S): Imitrex What should I tell my health care provider before I take this medicine? They need to know if you have any of these conditions: -bowel disease or colitis -diabetes -family history of heart disease -fast or irregular heart beat -heart or blood vessel disease, angina (chest pain), or previous heart attack -high blood pressure -high cholesterol -history of stroke, transient ischemic attacks (TIAs or mini-strokes), or intracranial bleeding -kidney or liver disease -overweight -poor circulation -postmenopausal or surgical removal of uterus and ovaries -Raynaud's disease -seizure disorder -an unusual or allergic reaction to sumatriptan, other medicines, foods, dyes, or preservatives -pregnant or trying to get pregnant -breast-feeding How should I use this medicine? Take this medicine by mouth with a glass of water. Follow the directions on the prescription label. This medicine is taken at the first symptoms of a migraine. It is not for everyday use. If your migraine headache returns after one dose, you can take another dose as directed. You must leave at least 2 hours between doses, and do not take more than 100 mg as a single dose. Do not take more than 200 mg total in any 24 hour period. If there is no improvement at all after the first dose, do not take a second dose without talking to your doctor or health care professional. Do not take your medicine more often than directed. Talk to your pediatrician regarding the use of this medicine in children. Special care may be needed. Overdosage: If you think you have taken  too much of this medicine contact a poison control center or emergency room at once. NOTE: This medicine is only for you. Do not share this medicine with others. What if I miss a dose? This does not apply; this medicine is not for regular use. What may interact with this medicine? Do not take this medicine with any of the following medicines: -amphetamine or cocaine -dihydroergotamine, ergotamine, ergoloid mesylates, methysergide, or ergot-type medication - do not take within 24 hours of taking sumatriptan -feverfew -MAOIs like Carbex, Eldepryl, Marplan, Nardil, and Parnate - do not take sumatriptan within 2 weeks of stopping MAOI therapy -other migraine medicines like almotriptan, eletriptan, naratriptan, rizatriptan, zolmitriptan - do not take within 24 hours of taking sumatriptan -tryptophan This medicine may also interact with the following medications: -lithium -medicines for mental depression, anxiety or mood problems -medicines for weight loss such as dexfenfluramine, dextroamphetamine, fenfluramine, or sibutramine -St. John's wort This list may not describe all possible interactions. Give your health care provider a list of all the medicines, herbs, non-prescription drugs, or dietary supplements you use. Also tell them if you smoke, drink alcohol, or use illegal drugs. Some items may interact with your medicine. What should I watch for while using this medicine? Only take this medicine for a migraine headache. Take it if you get warning symptoms or at the start of a migraine attack. It is not for regular use to prevent migraine attacks. You may get drowsy or dizzy. Do not drive, use machinery, or do anything that needs mental alertness until you know how this medicine affects you. To reduce dizzy or fainting spells, do not  sit or stand up quickly, especially if you are an older patient. Alcohol can increase drowsiness, dizziness and flushing. Avoid alcoholic drinks. Smoking cigarettes  may increase the risk of heart-related side effects from using this medicine. If you take migraine medicines for 10 or more days a month, your migraines may get worse. Keep a diary of headache days and medicine use. Contact your healthcare professional if your migraine attacks occur more frequently. What side effects may I notice from receiving this medicine? Side effects that you should report to your doctor or health care professional as soon as possible: -allergic reactions like skin rash, itching or hives, swelling of the face, lips, or tongue -fast, slow, or irregular heart beat -hallucinations -increased or decreased blood pressure -seizures -severe stomach pain and cramping, bloody diarrhea -signs and symptoms of a blood clot such as breathing problems; changes in vision; chest pain; severe, sudden headache; pain, swelling, warmth in the leg; trouble speaking; sudden numbness or weakness of the face, arm or leg -tingling, pain, or numbness in the face, hands or feet Side effects that usually do not require medical attention (report to your doctor or health care professional if they continue or are bothersome): -drowsiness -feeling warm, flushing, or redness of the face -headache -muscle cramps, pain -nausea, vomiting -unusually weak or tired This list may not describe all possible side effects. Call your doctor for medical advice about side effects. You may report side effects to FDA at 1-800-FDA-1088. Where should I keep my medicine? Keep out of the reach of children. Store at room temperature between 2 and 30 degrees C (36 and 86 degrees F). Throw away any unused medicine after the expiration date. NOTE: This sheet is a summary. It may not cover all possible information. If you have questions about this medicine, talk to your doctor, pharmacist, or health care provider.  2015, [employer]. (2013-08-09 10:12:47)

## 2015-02-21 NOTE — Telephone Encounter (Signed)
02/21/15-[facility] the [facility] Mirena and insertion of Paraguard IUD at 100%, no copay or deductible even though we just inserted the Mirena in 12/15.wl BC Ref #[id number]

## 2015-03-01 ENCOUNTER — Encounter: Payer: Self-pay | Admitting: Gynecology

## 2015-03-01 ENCOUNTER — Ambulatory Visit (INDEPENDENT_AMBULATORY_CARE_PROVIDER_SITE_OTHER): Payer: BLUE CROSS/BLUE SHIELD | Admitting: Gynecology

## 2015-03-01 VITALS — BP 132/80

## 2015-03-01 DIAGNOSIS — Z975 Presence of (intrauterine) contraceptive device: Secondary | ICD-10-CM | POA: Insufficient documentation

## 2015-03-01 DIAGNOSIS — Z30433 Encounter for removal and reinsertion of intrauterine contraceptive device: Secondary | ICD-10-CM

## 2015-03-01 NOTE — Patient Instructions (Signed)

## 2015-03-01 NOTE — Progress Notes (Signed)
   Patient is a 24 year old who presented to the office to remove the Mirena IUD because she has continued to gain weight despite trying D healthier and exercise. Patient in the past had been on progesterone only oral contraceptive pill. Patient been tested in the past and has a history of Leiden 5 factor (heterozygous). She is here to replace the Mirena IUD for a nonhormonal IUD such as the ParaGard T380A IUD for which she had been provided with literature information previously.                                                                    IUD procedure note       Patient presented to the office today for placement of ParaGard T380A IUD. The patient had previously been provided with literature information on this method of contraception. The risks benefits and pros and cons were discussed and all her questions were answered. She is fully aware that this form of contraception is 99% effective and is good for 10 years.  Pelvic exam: Bartholin urethra Skene glands: Within normal limits Vagina: No lesions or discharge Cervix: No lesions or discharge Uterus: Anteverted position Adnexa: No masses or tenderness Rectal exam: Not done  The cervix was cleansed with Betadine solution. A single-tooth tenaculum was placed on the anterior cervical lip. The uterus sounded to 6-1/2 centimeter. The IUD was shown to the patient and inserted in a sterile fashion. The IUD string was trimmed. The single-tooth tenaculum was removed. Patient was instructed to return back to the office in one month for follow up.       ParaGard T380A placed 03/01/2015 lot #[id number]. IUD good for 10 years

## 2015-03-02 ENCOUNTER — Ambulatory Visit (INDEPENDENT_AMBULATORY_CARE_PROVIDER_SITE_OTHER): Payer: Self-pay | Admitting: Gynecology

## 2015-03-02 ENCOUNTER — Encounter: Payer: Self-pay | Admitting: Gynecology

## 2015-03-02 ENCOUNTER — Telehealth: Payer: Self-pay | Admitting: *Deleted

## 2015-03-02 VITALS — BP 122/78

## 2015-03-02 DIAGNOSIS — T7840XA Allergy, unspecified, initial encounter: Secondary | ICD-10-CM

## 2015-03-02 NOTE — Telephone Encounter (Signed)
Pt had Paraguard Iud placed yesterday c/o that last night patient and mother noticed her feet, ankles and hands were swollen. Pt said she didn't notice this prior to her visit for paraguard placement. Slight cramping from IUD, would you like patient to be worked in for OV today? Please advise

## 2015-03-02 NOTE — Telephone Encounter (Signed)
Please explain to her that this more than likely has nothing to do with the IUD unless she has an allergy to call copper and we may need to remove the IUD. See if she can come by this morning and I will be more than glad to look at her before 12:30

## 2015-03-02 NOTE — Telephone Encounter (Signed)
Kaylee CrapeClaudia will call patient to come in

## 2015-03-02 NOTE — Progress Notes (Signed)
   Patient is a 24 year old who presented to the office today concerned whether after yesterday removing her Mirena IUD and placing a nonhormonal IUD such as the ParaGard T380A IUD she noticed some swelling and thought she may have had a rash. Patient reports no shortness of breath no tightness in her chest no palpitations no ringing in the ears and did not appear to be any distress today. She was asked to come in for an examination.  Exam: Blood pressure 122/78     respiration 14 Gen. appearance well-developed overweight white female no acute distress Heart: Regular rate and rhythm no murmurs or gallops Lungs: Clear to auscultation Rogers or wheezes Abdomen: Soft nontender no rebound or guarding Pelvic exam: Bartholin urethra Skene was within normal limits Vagina: No lesions or discharge Cervix: No lesions or discharge IUD string was seen Uterus: Anteverted normal size shape and consistency Adnexa: No palpable masses or tenderness Rectal exam: Not done Skin no skin eruptions no whelps no erythema Lower extremity no evidence of any pitting edema both Measurements were equal. Popliteal, medial malleolus pulses and dorsalis pedis pulses were normal.  Assessment/plan: Patient status post replacing Mirena IUD for nonhormonal ParaGard T380A IUD 24 hours ago there was a questionable reaction no evidence of any hypersensitivity reaction rash or problems noted on exam today. Patient was reassured. She'll return to the office if symptoms return.

## 2015-04-18 ENCOUNTER — Ambulatory Visit: Payer: BLUE CROSS/BLUE SHIELD | Admitting: Gynecology

## 2015-04-18 ENCOUNTER — Other Ambulatory Visit: Payer: BLUE CROSS/BLUE SHIELD

## 2015-04-19 ENCOUNTER — Ambulatory Visit (INDEPENDENT_AMBULATORY_CARE_PROVIDER_SITE_OTHER): Payer: BLUE CROSS/BLUE SHIELD | Admitting: Gynecology

## 2015-04-19 ENCOUNTER — Encounter: Payer: Self-pay | Admitting: Gynecology

## 2015-04-19 VITALS — BP 132/86

## 2015-04-19 DIAGNOSIS — Z30431 Encounter for routine checking of intrauterine contraceptive device: Secondary | ICD-10-CM | POA: Diagnosis not present

## 2015-04-19 DIAGNOSIS — N83201 Unspecified ovarian cyst, right side: Secondary | ICD-10-CM

## 2015-04-19 DIAGNOSIS — N8320 Unspecified ovarian cysts: Secondary | ICD-10-CM

## 2015-04-19 NOTE — Progress Notes (Signed)
   Patient presented to the office for her 1 month follow-up after having removed her Mirena IUD and replaced with a ParaGard T380A IUD as a result of patient's concern about weight gain.Patient been tested in the past and has a history of Leiden 5 factor (heterozygous). Patient was asymptomatic.  Exam: Blood pressure 132/86 Abdomen: Soft nontender no rebound or guarding Pelvic: Bartholin urethra Skene was within normal limits Vagina: No lesions or discharge Cervix: No lesions or discharge, IUD string seen Uterus: Anteverted normal size shape and consistency Adnexa: No palpable masses or tenderness Rectal exam: Not done  Assessment/plan: 1 month status post placement of ParaGard T380A IUD doing well. Patient scheduled to return back in November for her annual exam will follow-up on a small right ovarian cyst with an ultrasound as well. She will come in a fasting state for screening fasting blood work. We discussed importance of good nutrition and regular exercise to help her lose weight.

## 2015-05-21 ENCOUNTER — Emergency Department (HOSPITAL_COMMUNITY): Payer: BLUE CROSS/BLUE SHIELD

## 2015-05-21 ENCOUNTER — Encounter (HOSPITAL_COMMUNITY): Payer: Self-pay | Admitting: Emergency Medicine

## 2015-05-21 ENCOUNTER — Emergency Department (HOSPITAL_COMMUNITY)
Admission: EM | Admit: 2015-05-21 | Discharge: 2015-05-21 | Disposition: A | Discharge status: Home / Self Care | Payer: BLUE CROSS/BLUE SHIELD | Attending: Emergency Medicine | Admitting: Emergency Medicine

## 2015-05-21 DIAGNOSIS — S4991XA Unspecified injury of right shoulder and upper arm, initial encounter: Secondary | ICD-10-CM | POA: Insufficient documentation

## 2015-05-21 DIAGNOSIS — Y9289 Other specified places as the place of occurrence of the external cause: Secondary | ICD-10-CM | POA: Insufficient documentation

## 2015-05-21 DIAGNOSIS — Y9389 Activity, other specified: Secondary | ICD-10-CM | POA: Insufficient documentation

## 2015-05-21 DIAGNOSIS — T148 Other injury of unspecified body region: Secondary | ICD-10-CM | POA: Insufficient documentation

## 2015-05-21 DIAGNOSIS — S060X1A Concussion with loss of consciousness of 30 minutes or less, initial encounter: Secondary | ICD-10-CM | POA: Diagnosis not present

## 2015-05-21 DIAGNOSIS — Z862 Personal history of diseases of the blood and blood-forming organs and certain disorders involving the immune mechanism: Secondary | ICD-10-CM | POA: Diagnosis not present

## 2015-05-21 DIAGNOSIS — S199XXA Unspecified injury of neck, initial encounter: Secondary | ICD-10-CM | POA: Diagnosis not present

## 2015-05-21 DIAGNOSIS — S0081XA Abrasion of other part of head, initial encounter: Secondary | ICD-10-CM | POA: Insufficient documentation

## 2015-05-21 DIAGNOSIS — Y998 Other external cause status: Secondary | ICD-10-CM | POA: Insufficient documentation

## 2015-05-21 DIAGNOSIS — S0990XA Unspecified injury of head, initial encounter: Secondary | ICD-10-CM | POA: Diagnosis present

## 2015-05-21 DIAGNOSIS — Z720 Tobacco use: Secondary | ICD-10-CM | POA: Diagnosis not present

## 2015-05-21 DIAGNOSIS — T148XXA Other injury of unspecified body region, initial encounter: Secondary | ICD-10-CM

## 2015-05-21 MED ORDER — METHOCARBAMOL 500 MG PO TABS
1000.0000 mg | ORAL_TABLET | Freq: Once | ORAL | Status: AC
  Administered 2015-05-21: 1000 mg via ORAL
  Filled 2015-05-21: qty 2

## 2015-05-21 MED ORDER — ONDANSETRON 4 MG PO TBDP
4.0000 mg | ORAL_TABLET | Freq: Once | ORAL | Status: AC
  Administered 2015-05-21: 4 mg via ORAL
  Filled 2015-05-21: qty 1

## 2015-05-21 MED ORDER — ONDANSETRON 4 MG PO TBDP: ORAL_TABLET | ORAL | Status: DC

## 2015-05-21 MED ORDER — METHOCARBAMOL 500 MG PO TABS: 1000.0000 mg | ORAL_TABLET | Freq: Three times a day (TID) | ORAL | Status: DC | PRN

## 2015-05-21 MED ORDER — ACETAMINOPHEN 325 MG PO TABS
650.0000 mg | ORAL_TABLET | ORAL | Status: DC | PRN
  Administered 2015-05-21: 650 mg via ORAL
  Filled 2015-05-21: qty 2
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
## 2015-05-21 NOTE — Progress Notes (Signed)
°   05/21/15 0108  Clinical Encounter Type  Visited With Family;Patient and family together;Health care provider  Visit Type Trauma  Referral From Nurse  Spiritual Encounters  Spiritual Needs Emotional  Stress Factors  Patient Stress Factors Health changes  Advance Directives (For Healthcare)  Does patient have an advance directive? No   Patient was alert and speaking with medical staff upon my arrival with patients parents. Patient was having x-rays and speaking with doctors. Parents stated that they would be the only visitors. Patient downgraded and I left the patient with family. Offered comfort and provided a ministry of presence. Patient and family stated they would reach out if they needed anything.  Lorna FewChristina Yarbrough, Contract Chaplain 05/21/2015, 1:22 AM

## 2015-05-21 NOTE — Discharge Instructions (Signed)
Concussion A concussion, or closed-head injury, is a brain injury caused by a direct blow to the head or by a quick and sudden movement (jolt) of the head or neck. Concussions are usually not life-threatening. Even so, the effects of a concussion can be serious. If you have had a concussion before, you are more likely to experience concussion-like symptoms after a direct blow to the head.  CAUSES  Direct blow to the head, such as from running into another player during a soccer game, being hit in a fight, or hitting your head on a hard surface.  A jolt of the head or neck that causes the brain to move back and forth inside the skull, such as in a car crash. SIGNS AND SYMPTOMS The signs of a concussion can be hard to notice. Early on, they may be missed by you, family members, and health care providers. You may look fine but act or feel differently. Symptoms are usually temporary, but they may last for days, weeks, or even longer. Some symptoms may appear right away while others may not show up for hours or days. Every head injury is different. Symptoms include:  Mild to moderate headaches that will not go away.  A feeling of pressure inside your head.  Having more trouble than usual:  Learning or remembering things you have heard.  Answering questions.  Paying attention or concentrating.  Organizing daily tasks.  Making decisions and solving problems.  Slowness in thinking, acting or reacting, speaking, or reading.  Getting lost or being easily confused.  Feeling tired all the time or lacking energy (fatigued).  Feeling drowsy.  Sleep disturbances.  Sleeping more than usual.  Sleeping less than usual.  Trouble falling asleep.  Trouble sleeping (insomnia).  Loss of balance or feeling lightheaded or dizzy.  Nausea or vomiting.  Numbness or tingling.  Increased sensitivity to:  Sounds.  Lights.  Distractions.  Vision problems or eyes that tire  easily.  Diminished sense of taste or smell.  Ringing in the ears.  Mood changes such as feeling sad or anxious.  Becoming easily irritated or angry for little or no reason.  Lack of motivation.  Seeing or hearing things other people do not see or hear (hallucinations). DIAGNOSIS Your health care provider can usually diagnose a concussion based on a description of your injury and symptoms. He or she will ask whether you passed out (lost consciousness) and whether you are having trouble remembering events that happened right before and during your injury. Your evaluation might include:  A brain scan to look for signs of injury to the brain. Even if the test shows no injury, you may still have a concussion.  Blood tests to be sure other problems are not present. TREATMENT  Concussions are usually treated in an emergency department, in urgent care, or at a clinic. You may need to stay in the hospital overnight for further treatment.  Tell your health care provider if you are taking any medicines, including prescription medicines, over-the-counter medicines, and natural remedies. Some medicines, such as blood thinners (anticoagulants) and aspirin, may increase the chance of complications. Also tell your health care provider whether you have had alcohol or are taking illegal drugs. This information may affect treatment.  Your health care provider will send you home with important instructions to follow.  How fast you will recover from a concussion depends on many factors. These factors include how severe your concussion is, what part of your brain was injured, your  age, and how healthy you were before the concussion. °· Most people with mild injuries recover fully. Recovery can take time. In general, recovery is slower in older persons. Also, persons who have had a concussion in the past or have other medical problems may find that it takes longer to recover from their current injury. °HOME  CARE INSTRUCTIONS °General Instructions °· Carefully follow the directions your health care provider gave you. °· Only take over-the-counter or prescription medicines for pain, discomfort, or fever as directed by your health care provider. °· Take only those medicines that your health care provider has approved. °· Do not drink alcohol until your health care provider says you are well enough to do so. Alcohol and certain other drugs may slow your recovery and can put you at risk of further injury. °· If it is harder than usual to remember things, write them down. °· If you are easily distracted, try to do one thing at a time. For example, do not try to watch TV while fixing dinner. °· Talk with family members or close friends when making important decisions. °· Keep all follow-up appointments. Repeated evaluation of your symptoms is recommended for your recovery. °· Watch your symptoms and tell others to do the same. Complications sometimes occur after a concussion. Older adults with a brain injury may have a higher risk of serious complications, such as a blood clot on the brain. °· Tell your teachers, school nurse, school counselor, coach, athletic trainer, or work manager about your injury, symptoms, and restrictions. Tell them about what you can or cannot do. They should watch for: °¨ Increased problems with attention or concentration. °¨ Increased difficulty remembering or learning new information. °¨ Increased time needed to complete tasks or assignments. °¨ Increased irritability or decreased ability to cope with stress. °¨ Increased symptoms. °· Rest. Rest helps the brain to heal. Make sure you: °¨ Get plenty of sleep at night. Avoid staying up late at night. °¨ Keep the same bedtime hours on weekends and weekdays. °¨ Rest during the day. Take daytime naps or rest breaks when you feel tired. °· Limit activities that require a lot of thought or concentration. These include: °¨ Doing homework or job-related  work. °¨ Watching TV. °¨ Working on the computer. °· Avoid any situation where there is potential for another head injury (football, hockey, soccer, basketball, martial arts, downhill snow sports and horseback riding). Your condition will get worse every time you experience a concussion. You should avoid these activities until you are evaluated by the appropriate follow-up health care providers. °Returning To Your Regular Activities °You will need to return to your normal activities slowly, not all at once. You must give your body and brain enough time for recovery. °· Do not return to sports or other athletic activities until your health care provider tells you it is safe to do so. °· Ask your health care provider when you can drive, ride a bicycle, or operate heavy machinery. Your ability to react may be slower after a brain injury. Never do these activities if you are dizzy. °· Ask your health care provider about when you can return to work or school. °Preventing Another Concussion °It is very important to avoid another brain injury, especially before you have recovered. In rare cases, another injury can lead to permanent brain damage, brain swelling, or death. The risk of this is greatest during the first 7-10 days after a head injury. Avoid injuries by: °· Wearing a seat   belt when riding in a car.  Drinking alcohol only in moderation.  Wearing a helmet when biking, skiing, skateboarding, skating, or doing similar activities.  Avoiding activities that could lead to a second concussion, such as contact or recreational sports, until your health care provider says it is okay.  Taking safety measures in your home.  Remove clutter and tripping hazards from floors and stairways.  Use grab bars in bathrooms and handrails by stairs.  Place non-slip mats on floors and in bathtubs.  Improve lighting in dim areas. SEEK MEDICAL CARE IF:  You have increased problems paying attention or  concentrating.  You have increased difficulty remembering or learning new information.  You need more time to complete tasks or assignments than before.  You have increased irritability or decreased ability to cope with stress.  You have more symptoms than before. Seek medical care if you have any of the following symptoms for more than 2 weeks after your injury:  Lasting (chronic) headaches.  Dizziness or balance problems.  Nausea.  Vision problems.  Increased sensitivity to noise or light.  Depression or mood swings.  Anxiety or irritability.  Memory problems.  Difficulty concentrating or paying attention.  Sleep problems.  Feeling tired all the time. SEEK IMMEDIATE MEDICAL CARE IF:  You have severe or worsening headaches. These may be a sign of a blood clot in the brain.  You have weakness (even if only in one hand, leg, or part of the face).  You have numbness.  You have decreased coordination.  You vomit repeatedly.  You have increased sleepiness.  One pupil is larger than the other.  You have convulsions.  You have slurred speech.  You have increased confusion. This may be a sign of a blood clot in the brain.  You have increased restlessness, agitation, or irritability.  You are unable to recognize people or places.  You have neck pain.  It is difficult to wake you up.  You have unusual behavior changes.  You lose consciousness. MAKE SURE YOU:  Understand these instructions.  Will watch your condition.  Will get help right away if you are not doing well or get worse. Document Released: 02/28/2004 Document Revised: 12/13/2013 Document Reviewed: 06/30/2013 [facility] Patient Information 2015 [place], [place]. This information is not intended to replace advice given to you by your health care provider. Make sure you discuss any questions you have with your health care provider.  Muscle Strain A muscle strain is an injury that occurs when a  muscle is stretched beyond its normal length. Usually a small number of muscle fibers are torn when this happens. Muscle strain is rated in degrees. First-degree strains have the least amount of muscle fiber tearing and pain. Second-degree and third-degree strains have increasingly more tearing and pain.  Usually, recovery from muscle strain takes 1-2 weeks. Complete healing takes 5-6 weeks.  CAUSES  Muscle strain happens when a sudden, violent force placed on a muscle stretches it too far. This may occur with lifting, sports, or a fall.  RISK FACTORS Muscle strain is especially common in athletes.  SIGNS AND SYMPTOMS At the site of the muscle strain, there may be:  Pain.  Bruising.  Swelling.  Difficulty using the muscle due to pain or lack of normal function. DIAGNOSIS  Your health care provider will perform a physical exam and ask about your medical history. TREATMENT  Often, the best treatment for a muscle strain is resting, icing, and applying cold compresses to the injured  area.  HOME CARE INSTRUCTIONS   Use the PRICE method of treatment to promote muscle healing during the first 2-3 days after your injury. The PRICE method involves:  Protecting the muscle from being injured again.  Restricting your activity and resting the injured body part.  Icing your injury. To do this, put ice in a plastic bag. Place a towel between your skin and the bag. Then, apply the ice and leave it on from 15-20 minutes each hour. After the third day, switch to moist heat packs.  Apply compression to the injured area with a splint or elastic bandage. Be careful not to wrap it too tightly. This may interfere with blood circulation or increase swelling.  Elevate the injured body part above the level of your heart as often as you can.  Only take over-the-counter or prescription medicines for pain, discomfort, or fever as directed by your health care provider.  Warming up prior to exercise helps to  prevent future muscle strains. SEEK MEDICAL CARE IF:   You have increasing pain or swelling in the injured area.  You have numbness, tingling, or a significant loss of strength in the injured area. MAKE SURE YOU:   Understand these instructions.  Will watch your condition.  Will get help right away if you are not doing well or get worse. Document Released: 12/08/2005 Document Revised: 09/28/2013 Document Reviewed: 07/07/2013 [facility] Patient Information 2015 [place], [place]. This information is not intended to replace advice given to you by your health care provider. Make sure you discuss any questions you have with your health care provider.

## 2015-06-01 ENCOUNTER — Telehealth: Payer: Self-pay | Admitting: *Deleted

## 2015-06-01 NOTE — Telephone Encounter (Signed)
Pts mother called worried that the patient is having some emotional instability with mood swings from anger to crying and not sleeping well. Requested some direction on what to do. If the patient is willing I would advise a visit with PCP and discuss a visit with psychologist. Mother stated she would start that route. KW CMA

## 2015-06-13 ENCOUNTER — Telehealth: Payer: Self-pay | Admitting: *Deleted

## 2015-06-13 NOTE — Telephone Encounter (Signed)
Pt left message in triage c/o issues with mirena IUD, I left message for pt to call.

## 2015-06-13 NOTE — Telephone Encounter (Signed)
Pt called with problems with Mirena IUD sharp pains at times. No pain now, pt will schedule OV with provider.

## 2015-06-21 ENCOUNTER — Ambulatory Visit (INDEPENDENT_AMBULATORY_CARE_PROVIDER_SITE_OTHER): Payer: BLUE CROSS/BLUE SHIELD | Admitting: Women's Health

## 2015-06-21 ENCOUNTER — Encounter: Payer: Self-pay | Admitting: Women's Health

## 2015-06-21 VITALS — BP 124/82 | Ht 61.0 in | Wt 187.0 lb

## 2015-06-21 DIAGNOSIS — N946 Dysmenorrhea, unspecified: Secondary | ICD-10-CM

## 2015-06-21 DIAGNOSIS — N921 Excessive and frequent menstruation with irregular cycle: Secondary | ICD-10-CM

## 2015-06-21 LAB — WET PREP FOR TRICH, YEAST, CLUE
Clue Cells Wet Prep HPF POC: NONE SEEN
Trich, Wet Prep: NONE SEEN
Yeast Wet Prep HPF POC: NONE SEEN

## 2015-06-21 MED ORDER — TRANEXAMIC ACID 650 MG PO TABS: 1300.0000 mg | ORAL_TABLET | Freq: Three times a day (TID) | ORAL | Status: DC

## 2015-06-21 MED ORDER — IBUPROFEN 600 MG PO TABS: 600.0000 mg | ORAL_TABLET | Freq: Three times a day (TID) | ORAL | Status: DC | PRN

## 2015-06-21 NOTE — Patient Instructions (Signed)

## 2015-06-21 NOTE — Addendum Note (Signed)
Addended by: Harrington ChallengerYOUNG, Heith Haigler J on: 06/21/2015 02:08 PM   Modules accepted: Orders, Medications

## 2015-06-21 NOTE — Progress Notes (Addendum)
Patient ID: Sallee Langehelsea L Magri, female   DOB: 10/30/1991, 24 y.o.   MRN: [id number] Presents with complaint of increased menstrual flow first 2 days of 4 days of monthly cycle with dysmenorrhea since  ParaGard IUD placed 02/2015.  Has cramps 2 weeks prior to cycle starting.  Same partner negative STD screening.  Had numerous problems with OCs and Mirena IUD with mood changes and emotional issues. Reports feeling better on ParaGard IUD. Denies vaginal discharge, urinary symptoms, fever. Reports cycles have always been heavy when not on OCs.  Name: Appears well. Abdomen soft nontender, external genitalia within normal limits, speculum exam scant discharge IUD strings visible, bimanual no CMT or adnexal tenderness. Wet prep negative.  ParaGard IUD with dysmenorrhea and menorrhagia  Plan: Lysteda 650 mg 3 times daily first 2 days of cycle, call if cycle flow does not decrease. Motrin 600 every 6 hours as needed for cramping. Call if continued problems on the will schedule ultrasound, history of small cyst.  After review with Dr. Lily PeerFernandez due to history of factor V Leiden will not use Lysteda telephone call to patient to review will use Motrin only.

## 2015-07-06 ENCOUNTER — Telehealth: Payer: Self-pay

## 2015-07-06 ENCOUNTER — Other Ambulatory Visit: Payer: Self-pay | Admitting: Gynecology

## 2015-07-06 DIAGNOSIS — N938 Other specified abnormal uterine and vaginal bleeding: Secondary | ICD-10-CM

## 2015-07-06 MED ORDER — MEGESTROL ACETATE 40 MG PO TABS: 40.0000 mg | ORAL_TABLET | Freq: Two times a day (BID) | ORAL | Status: DC

## 2015-07-06 MED ORDER — IBUPROFEN 800 MG PO TABS
800.0000 mg | ORAL_TABLET | Freq: Three times a day (TID) | ORAL | Status: DC | PRN
Start: 2015-07-06 — End: 2015-12-13

## 2015-07-06 NOTE — Telephone Encounter (Signed)
Encounter was already open.

## 2015-07-06 NOTE — Telephone Encounter (Signed)
1. Make sure she does a home urine pregnancy test and that she is not pregnant 2. Call in prescription for Megace 40 mg one by mouth twice a day for 10 days 3. Schedule ultrasound for next week 4. Call in prescription for Motrin 800 mg take one every 8 hours if needed  If she does not want to do the above she can come in today we can take her IUD

## 2015-07-06 NOTE — Telephone Encounter (Signed)
Patient informed of all of this.  Rx's sent and appt made for u/s for next week.

## 2015-07-06 NOTE — Telephone Encounter (Signed)
Patient has Paraguard IUD that was inserted in March.  She said she has been having a lot of cramping and pain since having it inserted and she came to see Harriett SineNancy on June 30. [place] prescribed Ibuprofen 600 and she has been taking that every day q 6 hours since visit.  Today cramps are even worse. She went to restroom and realized she is bleeding. She said it has only been 17 days since she had a period. She is concerned why she is bleeding.  [place] had recommended if it did not improve with plan to return for u/s.

## 2015-07-09 ENCOUNTER — Other Ambulatory Visit: Payer: Self-pay | Admitting: Gynecology

## 2015-07-09 ENCOUNTER — Encounter: Payer: Self-pay | Admitting: Gynecology

## 2015-07-09 ENCOUNTER — Ambulatory Visit (INDEPENDENT_AMBULATORY_CARE_PROVIDER_SITE_OTHER): Payer: BLUE CROSS/BLUE SHIELD

## 2015-07-09 ENCOUNTER — Ambulatory Visit (INDEPENDENT_AMBULATORY_CARE_PROVIDER_SITE_OTHER): Payer: BLUE CROSS/BLUE SHIELD | Admitting: Gynecology

## 2015-07-09 VITALS — BP 120/76

## 2015-07-09 DIAGNOSIS — Z30431 Encounter for routine checking of intrauterine contraceptive device: Secondary | ICD-10-CM

## 2015-07-09 DIAGNOSIS — N920 Excessive and frequent menstruation with regular cycle: Secondary | ICD-10-CM

## 2015-07-09 DIAGNOSIS — N941 Dyspareunia: Secondary | ICD-10-CM

## 2015-07-09 DIAGNOSIS — N938 Other specified abnormal uterine and vaginal bleeding: Secondary | ICD-10-CM | POA: Diagnosis not present

## 2015-07-09 DIAGNOSIS — R300 Dysuria: Secondary | ICD-10-CM | POA: Diagnosis not present

## 2015-07-09 DIAGNOSIS — IMO0002 Reserved for concepts with insufficient information to code with codable children: Secondary | ICD-10-CM

## 2015-07-09 DIAGNOSIS — T8389XA Other specified complication of genitourinary prosthetic devices, implants and grafts, initial encounter: Secondary | ICD-10-CM

## 2015-07-09 LAB — URINALYSIS W MICROSCOPIC + REFLEX CULTURE
BILIRUBIN URINE: NEGATIVE
CASTS: NONE SEEN
Crystals: NONE SEEN
GLUCOSE, UA: NEGATIVE mg/dL
KETONES UR: NEGATIVE mg/dL
Leukocytes, UA: NEGATIVE
Nitrite: NEGATIVE
PROTEIN: NEGATIVE mg/dL
Specific Gravity, Urine: >1.03 — ABNORMAL HIGH (ref 1.005–1.030)
UROBILINOGEN UA: 0.2 mg/dL (ref 0.0–1.0)
WBC, UA: NONE SEEN WBC/hpf (ref <3)
pH: 5 (ref 5.0–8.0)

## 2015-07-09 LAB — TSH: TSH: 1.186 u[IU]/mL (ref 0.350–4.500)

## 2015-07-09 NOTE — Addendum Note (Signed)
Addended by: Ok EdwardsFERNANDEZ, Eshani Maestre H on: 07/09/2015 12:22 PM   Modules accepted: Orders

## 2015-07-09 NOTE — Progress Notes (Signed)
   Patient is a 24 year old who in March of this year had a ParaGard T380A IUD placed for contraception because she was gaining weight with a Mirena IUD. Patient had been on progesterone only oral contraception pill the past for compliance was an issue. Patient with past history of Leiden 5 factor (heterozygous). She is here today because she was concerned that this cycle start at 17 days from previous one. She denies any nipple discharge or any unusual headache. But she does complain of having gained some weight. She denies any dysuria or frequency issues currently menstruating. She was having some mild low suprapubic discomfort.  Exam: Blood pressure 120/76 Gen. appearance well-developed well-nourished female with the above mentioned complaint : Back: No CVA tenderness Abdomen: Soft some mildly tender suprapubic discomfort but no rebound or guarding Pelvic: Bartholin urethra Skene was within normal limits Vagina: Menstrual blood present Cervix: IUD string visualized Bimanual exam: Uterus: Anteverted normal size shape and consistency nontender Adnexa: No palpable masses or tenderness Rectal exam not done  Urinalysis few bacteria, 20-50 RBC no white blood cells seen  Ultrasound: Uterus measures 7.7 x 5.7 x 3.5 cm with endometrial stripe of 7.3 mm. IUD was seen in the normal position. Both right and left ovary were normal. No fluid in the cul-de-sac. Arterial blood flow was seen in both ovaries.  Assessment/plan: Single isolated event of early menstrual cycle may been attributed to patient's weight gain. We are going to check a TSH level today. Ultrasound was normal. IUD in the proper position. We'll monitor her cycles over the course of next 3 months and not remove her IUD now. Patient reassured on the findings today.

## 2015-07-11 LAB — URINE CULTURE
COLONY COUNT: NO GROWTH
ORGANISM ID, BACTERIA: NO GROWTH

## 2015-10-31 ENCOUNTER — Telehealth: Payer: Self-pay | Admitting: Gynecology

## 2015-10-31 NOTE — Telephone Encounter (Signed)
Dr Lily PeerFernandez  This pt has been scheduled In December 2016 for a pelvic u/s one year ago 2015 for f/u ovarian cyst. I have already done an u/s on her in 07/16 ovary was normal no cyst seen. Do you want to cancel u/s exam in December??  Per Dr Lily PeerFernandez  Yes please   Note from 10/31/15 regarding u/s exam for December 01 /2016

## 2015-11-22 ENCOUNTER — Other Ambulatory Visit: Payer: BLUE CROSS/BLUE SHIELD

## 2015-11-22 ENCOUNTER — Encounter: Payer: BLUE CROSS/BLUE SHIELD | Admitting: Gynecology

## 2015-11-23 ENCOUNTER — Encounter: Payer: BLUE CROSS/BLUE SHIELD | Admitting: Gynecology

## 2015-11-23 ENCOUNTER — Other Ambulatory Visit: Payer: BLUE CROSS/BLUE SHIELD

## 2015-12-13 ENCOUNTER — Encounter: Payer: Self-pay | Admitting: Gynecology

## 2015-12-13 ENCOUNTER — Ambulatory Visit (INDEPENDENT_AMBULATORY_CARE_PROVIDER_SITE_OTHER): Payer: BLUE CROSS/BLUE SHIELD | Admitting: Gynecology

## 2015-12-13 VITALS — BP 124/78 | Ht 62.0 in | Wt 194.0 lb

## 2015-12-13 DIAGNOSIS — N940 Mittelschmerz: Secondary | ICD-10-CM | POA: Diagnosis not present

## 2015-12-13 DIAGNOSIS — N946 Dysmenorrhea, unspecified: Secondary | ICD-10-CM | POA: Diagnosis not present

## 2015-12-13 DIAGNOSIS — Z01419 Encounter for gynecological examination (general) (routine) without abnormal findings: Secondary | ICD-10-CM

## 2015-12-13 DIAGNOSIS — E663 Overweight: Secondary | ICD-10-CM

## 2015-12-13 MED ORDER — IBUPROFEN 800 MG PO TABS: 800.0000 mg | ORAL_TABLET | Freq: Three times a day (TID) | ORAL | Status: AC | PRN

## 2015-12-13 NOTE — Progress Notes (Signed)
Kaylee LangeChelsea L Collins 10/02/1991 [id number]   History:    24 y.o.  for annual gyn exam who suffers from midcycle cramping as well as dysmenorrhea. Patient in March of this year had a ParaGard T380A IUD placed for contraception because she was gaining weight with a Mirena IUD. Patient had been on progesterone only oral contraception pill the past for compliance was an issue. Patient with past history of Leiden 5 factor (heterozygous). Patient also was seen the office in July of this year because of low suprapubic cramping in the ultrasound demonstrated the following:  Ultrasound: Uterus measures 7.7 x 5.7 x 3.5 cm with endometrial stripe of 7.3 mm. IUD was seen in the normal position. Both right and left ovary were normal. No fluid in the cul-de-sac. Arterial blood flow was seen in both ovaries. Patient has a steady sexual partner. No past history of abnormal Pap smear. She has completed the HPV vaccine series.  Past medical history,surgical history, family history and social history were all reviewed and documented in the EPIC chart.  Gynecologic History Patient's last menstrual period was 11/18/2015. Contraception: IUD Last Pap: 2014. Results were: normal Last mammogram: Not indicated. Results were: Not indicated  Obstetric History OB History  Gravida Para Term Preterm AB SAB TAB Ectopic Multiple Living  0                  ROS: A ROS was performed and pertinent positives and negatives are included in the history.  GENERAL: No fevers or chills. HEENT: No change in vision, no earache, sore throat or sinus congestion. NECK: No pain or stiffness. CARDIOVASCULAR: No chest pain or pressure. No palpitations. PULMONARY: No shortness of breath, cough or wheeze. GASTROINTESTINAL: No abdominal pain, nausea, vomiting or diarrhea, melena or bright red blood per rectum. GENITOURINARY: No urinary frequency, urgency, hesitancy or dysuria. MUSCULOSKELETAL: No joint or muscle pain, no back pain, no recent  trauma. DERMATOLOGIC: No rash, no itching, no lesions. ENDOCRINE: No polyuria, polydipsia, no heat or cold intolerance. No recent change in weight. HEMATOLOGICAL: No anemia or easy bruising or bleeding. NEUROLOGIC: No headache, seizures, numbness, tingling or weakness. PSYCHIATRIC: No depression, no loss of interest in normal activity or change in sleep pattern.     Exam: chaperone present  BP 124/78 mmHg  Ht 5\' 2"  (1.575 m)  Wt 194 lb (87.998 kg)  BMI 35.47 kg/m2  LMP 11/18/2015  Body mass index is 35.47 kg/(m^2).  General appearance : Well developed well nourished female. No acute distress HEENT: Eyes: no retinal hemorrhage or exudates,  Neck supple, trachea midline, no carotid bruits, no thyroidmegaly Lungs: Clear to auscultation, no rhonchi or wheezes, or rib retractions  Heart: Regular rate and rhythm, no murmurs or gallops Breast:Examined in sitting and supine position were symmetrical in appearance, no palpable masses or tenderness,  no skin retraction, no nipple inversion, no nipple discharge, no skin discoloration, no axillary or supraclavicular lymphadenopathy Abdomen: no palpable masses or tenderness, no rebound or guarding Extremities: no edema or skin discoloration or tenderness  Pelvic:  Bartholin, Urethra, Skene Glands: Within normal limits             Vagina: No gross lesions or discharge  Cervix: No gross lesions or discharge, IUD string seen  Uterus  anteverted, normal size, shape and consistency, non-tender and mobile  Adnexa  Without masses or tenderness  Anus and perineum  normal   Rectovaginal  normal sphincter tone without palpated masses or tenderness  Hemoccult not indicated     Assessment/Plan:  24 y.o. female for annual exam who after review of her medical record indicated that since 2014 she has gained 52 pounds. Patient is not eating healthy not exercising regularly and has informing that she does drink alcohol but only during the weekends.  I'm going to provide her with a cholesterol lowering diet handout as well as a on exercise. I am going to refer her to the nutritionist for further dietary counseling. We discussed but the detrimental effects as alcohol. Patient's father had heart attack before the age of 97 and there is cardiovascular disease in the family. We discussed her potential risk of same as well as hypertension and diabetes. She is going to work on changing her lifestyle to improve her health. She will return back to the office next week in a fasting state for the following blood work: Fasting lipid profile, TSH, CBC, conference metabolic panel, and urinalysis. Pap smear not indicated today.   Ok Edwards MD, 4:30 PM 12/13/2015

## 2015-12-13 NOTE — Patient Instructions (Addendum)
Patient information: High cholesterol (The Basics)  What is cholesterol? - Cholesterol is a substance that is found in the blood. Everyone has some. It is needed for good health. The problem is, people sometimes have too much cholesterol. Compared with people with normal cholesterol, people with high cholesterol have a higher risk of heart attacks, strokes, and other health problems. The higher your cholesterol, the higher your risk of these problems. Cholesterol levels in your body are determined significantly by your diet. Cholesterol levels may also be related to heart disease. The following material helps to explain this relationship and discusses what you can do to help keep your heart healthy. Not all cholesterol is bad. Low-density lipoprotein (LDL) cholesterol is the "bad" cholesterol. It may cause fatty deposits to build up inside your arteries. High-density lipoprotein (HDL) cholesterol is "good." It helps to remove the "bad" LDL cholesterol from your blood. Cholesterol is a very important risk factor for heart disease. Other risk factors are high blood pressure, smoking, stress, heredity, and weight.  The heart muscle gets its supply of blood through the coronary arteries. If your LDL cholesterol is high and your HDL cholesterol is low, you are at risk for having fatty deposits build up in your coronary arteries. This leaves less room through which blood can flow. Without sufficient blood and oxygen, the heart muscle cannot function properly and you may feel chest pains (angina pectoris). When a coronary artery closes up entirely, a part of the heart muscle may die, causing a heart attack (myocardial infarction).  CHECKING CHOLESTEROL When your caregiver sends your blood to a lab to be analyzed for cholesterol, a complete lipid (fat) profile may be done. With this test, the total amount of cholesterol and levels of LDL and HDL are determined. Triglycerides  are a type of fat that circulates in the blood and can also be used to determine heart disease risk. Are there different types of cholesterol? - Yes, there are a few different types. If you get a cholesterol test, you may hear your doctor or nurse talk about: Total cholesterol  LDL cholesterol - Some people call this the "bad" cholesterol. That's because having high LDL levels raises your risk of heart attacks, strokes, and other health problems.  HDL cholesterol - Some people call this the "good" cholesterol. That's because having high HDL levels lowers your risk of heart attacks, strokes, and other health problems.  Non-HDL cholesterol - Non-HDL cholesterol is your total cholesterol minus your HDL cholesterol.  Triglycerides - Triglycerides are not cholesterol. They are a type of fat. But they often get measured when cholesterol is measured. (Having high triglycerides also seems to increase the risk of heart attacks and strokes.)   Keep in mind, though, that many people who cannot meet these goals still have a low risk of heart attacks and strokes. What should I do if my doctor tells me I have high cholesterol? - Ask your doctor what your overall risk of heart attacks and strokes is. High cholesterol, by itself, is not always a reason to worry. Having high cholesterol is just one of many things that can increase your risk of heart attacks and strokes. Other factors that increase your risk include:  Cigarette smoking  High blood pressure  Having a parent, sister, or brother who got heart disease at a young age (Young, in this case, means younger than 55 for men and younger than 65 for women.)  Being a man (Women are at risk, too, but men   have a higher risk.)  Older age  If you are at high risk of heart attacks and strokes, having high cholesterol is a problem. On the other hand, if you have are at low risk, having high cholesterol may not mean much. Should I take medicine to lower cholesterol? - Not  everyone who has high cholesterol needs medicines. Your doctor or nurse will decide if you need them based on your age, family history, and other health concerns.  You should probably take a cholesterol-lowering medicine called a statin if you: Already had a heart attack or stroke  Have known heart disease  Have diabetes  Have a condition called peripheral artery disease, which makes it painful to walk, and happens when the arteries in your legs get clogged with fatty deposits  Have an abdominal aortic aneurysm, which is a widening of the main artery in the belly  Most people with any of the conditions listed above should take a statin no matter what their cholesterol level is. If your doctor or nurse puts you on a statin, stay on it. The medicine may not make you feel any different. But it can help prevent heart attacks, strokes, and death.  Can I lower my cholesterol without medicines? - Yes, you can lower your cholesterol some by:  Avoiding red meat, butter, fried foods, cheese, and other foods that have a lot of saturated fat  Losing weight (if you are overweight)  Being more active Even if these steps do little to change your cholesterol, they can improve your health in many ways.                                                   Cholesterol Control Diet  CONTROLLING CHOLESTEROL WITH DIET Although exercise and lifestyle factors are important, your diet is key. That is because certain foods are known to raise cholesterol and others to lower it. The goal is to balance foods for their effect on cholesterol and more importantly, to replace saturated and trans fat with other types of fat, such as monounsaturated fat, polyunsaturated fat, and omega-3 fatty acids. On average, a person should consume no more than 15 to 17 g of saturated fat daily. Saturated and trans fats are considered "bad" fats, and they will raise LDL cholesterol. Saturated fats are primarily found in animal products such as  meats, butter, and cream. However, that does not mean you need to sacrifice all your favorite foods. Today, there are good tasting, low-fat, low-cholesterol substitutes for most of the things you like to eat. Choose low-fat or nonfat alternatives. Choose round or loin cuts of red meat, since these types of cuts are lowest in fat and cholesterol. Chicken (without the skin), fish, veal, and ground turkey breast are excellent choices. Eliminate fatty meats, such as hot dogs and salami. Even shellfish have little or no saturated fat. Have a 3 oz (85 g) portion when you eat lean meat, poultry, or fish. Trans fats are also called "partially hydrogenated oils." They are oils that have been scientifically manipulated so that they are solid at room temperature resulting in a longer shelf life and improved taste and texture of foods in which they are added. Trans fats are found in stick margarine, some tub margarines, cookies, crackers, and baked goods.  When baking and cooking, oils are an excellent substitute   for butter. The monounsaturated oils are especially beneficial since it is believed they lower LDL and raise HDL. The oils you should avoid entirely are saturated tropical oils, such as coconut and palm.  Remember to eat liberally from food groups that are naturally free of saturated and trans fat, including fish, fruit, vegetables, beans, grains (barley, rice, couscous, bulgur wheat), and pasta (without cream sauces).  IDENTIFYING FOODS THAT LOWER CHOLESTEROL  Soluble fiber may lower your cholesterol. This type of fiber is found in fruits such as apples, vegetables such as broccoli, potatoes, and carrots, legumes such as beans, peas, and lentils, and grains such as barley. Foods fortified with plant sterols (phytosterol) may also lower cholesterol. You should eat at least 2 g per day of these foods for a cholesterol lowering effect.  Read package labels to identify low-saturated fats, trans fats free, and  low-fat foods at the supermarket. Select cheeses that have only 2 to 3 g saturated fat per ounce. Use a heart-healthy tub margarine that is free of trans fats or partially hydrogenated oil. When buying baked goods (cookies, crackers), avoid partially hydrogenated oils. Breads and muffins should be made from whole grains (whole-wheat or whole oat flour, instead of "flour" or "enriched flour"). Buy non-creamy canned soups with reduced salt and no added fats.  FOOD PREPARATION TECHNIQUES  Never deep-fry. If you must fry, either stir-fry, which uses very little fat, or use non-stick cooking sprays. When possible, broil, bake, or roast meats, and steam vegetables. Instead of dressing vegetables with butter or margarine, use lemon and herbs, applesauce and cinnamon (for squash and sweet potatoes), nonfat yogurt, salsa, and low-fat dressings for salads.  LOW-SATURATED FAT / LOW-FAT FOOD SUBSTITUTES Meats / Saturated Fat (g)  Avoid: Steak, marbled (3 oz/85 g) / 11 g   Choose: Steak, lean (3 oz/85 g) / 4 g   Avoid: Hamburger (3 oz/85 g) / 7 g   Choose: Hamburger, lean (3 oz/85 g) / 5 g   Avoid: Ham (3 oz/85 g) / 6 g   Choose: Ham, lean cut (3 oz/85 g) / 2.4 g   Avoid: Chicken, with skin, dark meat (3 oz/85 g) / 4 g   Choose: Chicken, skin removed, dark meat (3 oz/85 g) / 2 g   Avoid: Chicken, with skin, light meat (3 oz/85 g) / 2.5 g   Choose: Chicken, skin removed, light meat (3 oz/85 g) / 1 g  Dairy / Saturated Fat (g)  Avoid: Whole milk (1 cup) / 5 g   Choose: Low-fat milk, 2% (1 cup) / 3 g   Choose: Low-fat milk, 1% (1 cup) / 1.5 g   Choose: Skim milk (1 cup) / 0.3 g   Avoid: Hard cheese (1 oz/28 g) / 6 g   Choose: Skim milk cheese (1 oz/28 g) / 2 to 3 g   Avoid: Cottage cheese, 4% fat (1 cup) / 6.5 g   Choose: Low-fat cottage cheese, 1% fat (1 cup) / 1.5 g   Avoid: Ice cream (1 cup) / 9 g   Choose: Sherbet (1 cup) / 2.5 g   Choose: Nonfat frozen yogurt (1 cup) / 0.3 g    Choose: Frozen fruit bar / trace   Avoid: Whipped cream (1 tbs) / 3.5 g   Choose: Nondairy whipped topping (1 tbs) / 1 g  Condiments / Saturated Fat (g)  Avoid: Mayonnaise (1 tbs) / 2 g   Choose: Low-fat mayonnaise (1 tbs) / 1 g   Avoid:   Butter (1 tbs) / 7 g   Choose: Extra light margarine (1 tbs) / 1 g   Avoid: Coconut oil (1 tbs) / 11.8 g   Choose: Olive oil (1 tbs) / 1.8 g   Choose: Corn oil (1 tbs) / 1.7 g   Choose: Safflower oil (1 tbs) / 1.2 g   Choose: Sunflower oil (1 tbs) / 1.4 g   Choose: Soybean oil (1 tbs) / 2.4 g   Choose: Canola oil (1 tbs) / 1 g  Exercise to Lose Weight Exercise and a healthy diet may help you lose weight. Your doctor may suggest specific exercises. EXERCISE IDEAS AND TIPS Choose low-cost things you enjoy doing, such as walking, bicycling, or exercising to workout videos.  Take stairs instead of the elevator.  Walk during your lunch break.  Park your car further away from work or school.  Go to a gym or an exercise class.  Start with 5 to 10 minutes of exercise each day. Build up to 30 minutes of exercise 4 to 6 days a week.  Wear shoes with good support and comfortable clothes.  Stretch before and after working out.  Work out until you breathe harder and your heart beats faster.  Drink extra water when you exercise.  Do not do so much that you hurt yourself, feel dizzy, or get very short of breath.  Exercises that burn about 150 calories: Running 1  miles in 15 minutes.  Playing volleyball for 45 to 60 minutes.  Washing and waxing a car for 45 to 60 minutes.  Playing touch football for 45 minutes.  Walking 1  miles in 35 minutes.  Pushing a stroller 1  miles in 30 minutes.  Playing basketball for 30 minutes.  Raking leaves for 30 minutes.  Bicycling 5 miles in 30 minutes.  Walking 2 miles in 30 minutes.  Dancing for 30 minutes.  Shoveling snow for 15 minutes.  Swimming laps for 20 minutes.  Walking up stairs for 15  minutes.  Bicycling 4 miles in 15 minutes.  Gardening for 30 to 45 minutes.  Jumping rope for 15 minutes.  Washing windows or floors for 45 to 60 minutes.  Document Released: 01/10/2011 Document Revised: 08/20/2011 Document Reviewed: 01/10/2011 [facility] Patient Information 2012 [place], [place].   Alcohol Use Disorder Alcohol use disorder is a mental disorder. It is not a one-time incident of heavy drinking. Alcohol use disorder is the excessive and uncontrollable use of alcohol over time that leads to problems with functioning in one or more areas of daily living. People with this disorder risk harming themselves and others when they drink to excess. Alcohol use disorder also can cause other mental disorders, such as mood and anxiety disorders, and serious physical problems. People with alcohol use disorder often misuse other drugs.  Alcohol use disorder is common and widespread. Some people with this disorder drink alcohol to cope with or escape from negative life events. Others drink to relieve chronic pain or symptoms of mental illness. People with a family history of alcohol use disorder are at higher risk of losing control and using alcohol to excess.  Drinking too much alcohol can cause injury, accidents, and health problems. One drink can be too much when you are: Working. Pregnant or breastfeeding. Taking medicines. Ask your doctor. Driving or planning to drive. SYMPTOMS  Signs and symptoms of alcohol use disorder may include the following:  Consumption ofalcohol inlarger amounts or over a longer period of time than intended. Multiple  unsuccessful attempts to cutdown or control alcohol use.  A great deal of time spent obtaining alcohol, using alcohol, or recovering from the effects of alcohol (hangover). A strong desire or urge to use alcohol (cravings).  Continued use of alcohol despite problems at work, school, or home because of alcohol use.  Continued use of alcohol despite  problems in relationships because of alcohol use. Continued use of alcohol in situations when it is physically hazardous, such as driving a car. Continued use of alcohol despite awareness of a physical or psychological problem that is likely related to alcohol use. Physical problems related to alcohol use can involve the brain, heart, liver, stomach, and intestines. Psychological problems related to alcohol use include intoxication, depression, anxiety, psychosis, delirium, and dementia.  The need for increased amounts of alcohol to achieve the same desired effect, or a decreased effect from the consumption of the same amount of alcohol (tolerance). Withdrawal symptoms upon reducing or stopping alcohol use, or alcohol use to reduce or avoid withdrawal symptoms. Withdrawal symptoms include: Racing heart. Hand tremor. Difficulty sleeping. Nausea. Vomiting. Hallucinations. Restlessness. Seizures. DIAGNOSIS Alcohol use disorder is diagnosed through an assessment by your health care provider. Your health care provider may start by asking three or four questions to screen for excessive or problematic alcohol use. To confirm a diagnosis of alcohol use disorder, at least two symptoms must be present within a 59-month period. The severity of alcohol use disorder depends on the number of symptoms: Mild--two or three. Moderate--four or five. Severe--six or more. Your health care provider may perform a physical exam or use results from lab tests to see if you have physical problems resulting from alcohol use. Your health care provider may refer you to a mental health professional for evaluation. TREATMENT  Some people with alcohol use disorder are able to reduce their alcohol use to low-risk levels. Some people with alcohol use disorder need to quit drinking alcohol. When necessary, mental health professionals with specialized training in substance use treatment can help. Your health care provider can help  you decide how severe your alcohol use disorder is and what type of treatment you need. The following forms of treatment are available:  Detoxification. Detoxification involves the use of prescription medicines to prevent alcohol withdrawal symptoms in the first week after quitting. This is important for people with a history of symptoms of withdrawal and for heavy drinkers who are likely to have withdrawal symptoms. Alcohol withdrawal can be dangerous and, in severe cases, cause death. Detoxification is usually provided in a hospital or in-patient substance use treatment facility. Counseling or talk therapy. Talk therapy is provided by substance use treatment counselors. It addresses the reasons people use alcohol and ways to keep them from drinking again. The goals of talk therapy are to help people with alcohol use disorder find healthy activities and ways to cope with life stress, to identify and avoid triggers for alcohol use, and to handle cravings, which can cause relapse. Medicines.Different medicines can help treat alcohol use disorder through the following actions: Decrease alcohol cravings. Decrease the positive reward response felt from alcohol use. Produce an uncomfortable physical reaction when alcohol is used (aversion therapy). Support groups. Support groups are run by people who have quit drinking. They provide emotional support, advice, and guidance. These forms of treatment are often combined. Some people with alcohol use disorder benefit from intensive combination treatment provided by specialized substance use treatment centers. Both inpatient and outpatient treatment programs are available.   This  information is not intended to replace advice given to you by your health care provider. Make sure you discuss any questions you have with your health care provider.   Document Released: 01/15/2005 Document Revised: 12/29/2014 Document Reviewed: 03/17/2013 [employer] Interactive Patient  Education [employer]2016 [employer]

## 2015-12-14 ENCOUNTER — Telehealth: Payer: Self-pay | Admitting: *Deleted

## 2015-12-14 DIAGNOSIS — E663 Overweight: Secondary | ICD-10-CM

## 2015-12-14 NOTE — Telephone Encounter (Signed)
Referral placed they will contact pt to schedule. 

## 2015-12-14 NOTE — Telephone Encounter (Signed)
-----   Message from Ok EdwardsJuan H Fernandez, MD sent at 12/13/2015  4:16 PM EST ----- Need appointment for nutritional counseling on this young overweight patient

## 2015-12-19 ENCOUNTER — Other Ambulatory Visit: Payer: BLUE CROSS/BLUE SHIELD

## 2016-01-03 NOTE — Telephone Encounter (Signed)
Pt declined referral per [facility]

## 2016-02-22 ENCOUNTER — Ambulatory Visit: Payer: BLUE CROSS/BLUE SHIELD | Admitting: Gynecology

## 2016-02-28 ENCOUNTER — Ambulatory Visit: Payer: BLUE CROSS/BLUE SHIELD | Admitting: Gynecology

## 2016-02-28 ENCOUNTER — Encounter: Payer: Self-pay | Admitting: Women's Health

## 2016-02-28 ENCOUNTER — Ambulatory Visit (INDEPENDENT_AMBULATORY_CARE_PROVIDER_SITE_OTHER): Payer: BLUE CROSS/BLUE SHIELD | Admitting: Women's Health

## 2016-02-28 VITALS — BP 126/84

## 2016-02-28 DIAGNOSIS — N898 Other specified noninflammatory disorders of vagina: Secondary | ICD-10-CM

## 2016-02-28 DIAGNOSIS — B3731 Acute candidiasis of vulva and vagina: Secondary | ICD-10-CM

## 2016-02-28 DIAGNOSIS — Z975 Presence of (intrauterine) contraceptive device: Secondary | ICD-10-CM | POA: Diagnosis not present

## 2016-02-28 DIAGNOSIS — Z30011 Encounter for initial prescription of contraceptive pills: Secondary | ICD-10-CM | POA: Diagnosis not present

## 2016-02-28 DIAGNOSIS — B373 Candidiasis of vulva and vagina: Secondary | ICD-10-CM

## 2016-02-28 LAB — WET PREP FOR TRICH, YEAST, CLUE
CLUE CELLS WET PREP: NONE SEEN
Trich, Wet Prep: NONE SEEN

## 2016-02-28 MED ORDER — FLUCONAZOLE 150 MG PO TABS: 150.0000 mg | ORAL_TABLET | Freq: Once | ORAL | Status: DC

## 2016-02-28 MED ORDER — NORETHINDRONE 0.35 MG PO TABS: 1.0000 | ORAL_TABLET | Freq: Every day | ORAL | Status: DC

## 2016-02-28 NOTE — Progress Notes (Signed)
Patient ID: Sallee Langehelsea L Hush, female   DOB: 12/19/1991, 25 y.o.   MRN: [id number] Presents with request to have ParaGard IUD removed, placed 02/2015. Has had problems with dysmenorrhea several days prior to cycle and during cycle the entire time,  felt it would get better but it has not. No bleeding between cycles. Mirena IUD prior had 4 months of continuous bleeding or spotting. Factor V Leiden. Same partner greater than 3 years. Denies urinary symptoms or vaginal discharge.  Exam: Appears well. External genitalia within normal limits, speculum exam moderate amount of a white discharge mild erythema, wet prep positive for yeast. IUD strings visualized grasped with ring forcep IUD removed intact, shown to patient and discarded. Bimanual no CMT or adnexal tenderness or fullness.  Contraception management Yeast vaginitis  Plan: Contraception options reviewed would like to try Micronor, prescription, proper use given and reviewed, reviewed no placebo week. Instructed to call if problems with spotting or if dysmenorrhea does not decrease. Diflucan 150 by mouth 1 dose.

## 2016-02-28 NOTE — Patient Instructions (Signed)
Norethindrone tablets (contraception) What is this medicine? NORETHINDRONE (nor eth IN drone) is an oral contraceptive. The product contains a female hormone known as a progestin. It is used to prevent pregnancy. This medicine may be used for other purposes; ask your health care provider or pharmacist if you have questions. What should I tell my health care provider before I take this medicine? They need to know if you have any of these conditions: -blood vessel disease or blood clots -breast, cervical, or vaginal cancer -diabetes -heart disease -kidney disease -liver disease -mental depression -migraine -seizures -stroke -vaginal bleeding -an unusual or allergic reaction to norethindrone, other medicines, foods, dyes, or preservatives -pregnant or trying to get pregnant -breast-feeding How should I use this medicine? Take this medicine by mouth with a glass of water. You may take it with or without food. Follow the directions on the prescription label. Take this medicine at the same time each day and in the order directed on the package. Do not take your medicine more often than directed. Contact your pediatrician regarding the use of this medicine in children. Special care may be needed. This medicine has been used in female children who have started having menstrual periods. A patient package insert for the product will be given with each prescription and refill. Read this sheet carefully each time. The sheet may change frequently. Overdosage: If you think you have taken too much of this medicine contact a poison control center or emergency room at once. NOTE: This medicine is only for you. Do not share this medicine with others. What if I miss a dose? Try not to miss a dose. Every time you miss a dose or take a dose late your chance of pregnancy increases. When 1 pill is missed (even if only 3 hours late), take the missed pill as soon as possible and continue taking a pill each day at  the regular time (use a back up method of birth control for the next 48 hours). If more than 1 dose is missed, use an additional birth control method for the rest of your pill pack until menses occurs. Contact your health care professional if more than 1 dose has been missed. What may interact with this medicine? Do not take this medicine with any of the following medications: -amprenavir or fosamprenavir -bosentan This medicine may also interact with the following medications: -antibiotics or medicines for infections, especially rifampin, rifabutin, rifapentine, and griseofulvin, and possibly penicillins or tetracyclines -aprepitant -barbiturate medicines, such as phenobarbital -carbamazepine -felbamate -modafinil -oxcarbazepine -phenytoin -ritonavir or other medicines for HIV infection or AIDS -St. John's wort -topiramate This list may not describe all possible interactions. Give your health care provider a list of all the medicines, herbs, non-prescription drugs, or dietary supplements you use. Also tell them if you smoke, drink alcohol, or use illegal drugs. Some items may interact with your medicine. What should I watch for while using this medicine? Visit your doctor or health care professional for regular checks on your progress. You will need a regular breast and pelvic exam and Pap smear while on this medicine. Use an additional method of birth control during the first cycle that you take these tablets. If you have any reason to think you are pregnant, stop taking this medicine right away and contact your doctor or health care professional. If you are taking this medicine for hormone related problems, it may take several cycles of use to see improvement in your condition. This medicine does not protect you   against HIV infection (AIDS) or any other sexually transmitted diseases. What side effects may I notice from receiving this medicine? Side effects that you should report to your  doctor or health care professional as soon as possible: -breast tenderness or discharge -pain in the abdomen, chest, groin or leg -severe headache -skin rash, itching, or hives -sudden shortness of breath -unusually weak or tired -vision or speech problems -yellowing of skin or eyes Side effects that usually do not require medical attention (report to your doctor or health care professional if they continue or are bothersome): -changes in sexual desire -change in menstrual flow -facial hair growth -fluid retention and swelling -headache -irritability -nausea -weight gain or loss This list may not describe all possible side effects. Call your doctor for medical advice about side effects. You may report side effects to FDA at 1-800-FDA-1088. Where should I keep my medicine? Keep out of the reach of children. Store at room temperature between 15 and 30 degrees C (59 and 86 degrees F). Throw away any unused medicine after the expiration date. NOTE: This sheet is a summary. It may not cover all possible information. If you have questions about this medicine, talk to your doctor, pharmacist, or health care provider.    2016, Elsevier/Gold Standard. (2012-08-27 16:41:35)  
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
## 2016-10-15 ENCOUNTER — Ambulatory Visit: Payer: BLUE CROSS/BLUE SHIELD | Admitting: Gynecology

## 2016-10-24 ENCOUNTER — Ambulatory Visit: Payer: BLUE CROSS/BLUE SHIELD | Admitting: Gynecology

## 2016-10-29 ENCOUNTER — Ambulatory Visit (INDEPENDENT_AMBULATORY_CARE_PROVIDER_SITE_OTHER): Payer: BLUE CROSS/BLUE SHIELD | Admitting: Gynecology

## 2016-10-29 ENCOUNTER — Encounter: Payer: Self-pay | Admitting: Gynecology

## 2016-10-29 VITALS — BP 126/84

## 2016-10-29 DIAGNOSIS — D6851 Activated protein C resistance: Secondary | ICD-10-CM

## 2016-10-29 DIAGNOSIS — Z3009 Encounter for other general counseling and advice on contraception: Secondary | ICD-10-CM

## 2016-10-29 NOTE — Patient Instructions (Signed)
Levonorgestrel intrauterine device (IUD) What is this medicine? LEVONORGESTREL IUD (LEE voe nor jes trel) is a contraceptive (birth control) device. The device is placed inside the uterus by a healthcare professional. It is used to prevent pregnancy and can also be used to treat heavy bleeding that occurs during your period. Depending on the device, it can be used for 3 to 5 years. This medicine may be used for other purposes; ask your health care provider or pharmacist if you have questions. What should I tell my health care provider before I take this medicine? They need to know if you have any of these conditions: -abnormal Pap smear -cancer of the breast, uterus, or cervix -diabetes -endometritis -genital or pelvic infection now or in the past -have more than one sexual partner or your partner has more than one partner -heart disease -history of an ectopic or tubal pregnancy -immune system problems -IUD in place -liver disease or tumor -problems with blood clots or take blood-thinners -use intravenous drugs -uterus of unusual shape -vaginal bleeding that has not been explained -an unusual or allergic reaction to levonorgestrel, other hormones, silicone, or polyethylene, medicines, foods, dyes, or preservatives -pregnant or trying to get pregnant -breast-feeding How should I use this medicine? This device is placed inside the uterus by a health care professional. Talk to your pediatrician regarding the use of this medicine in children. Special care may be needed. Overdosage: If you think you have taken too much of this medicine contact a poison control center or emergency room at once. NOTE: This medicine is only for you. Do not share this medicine with others. What if I miss a dose? This does not apply. What may interact with this medicine? Do not take this medicine with any of the following medications: -amprenavir -bosentan -fosamprenavir This medicine may also interact with  the following medications: -aprepitant -barbiturate medicines for inducing sleep or treating seizures -bexarotene -griseofulvin -medicines to treat seizures like carbamazepine, ethotoin, felbamate, oxcarbazepine, phenytoin, topiramate -modafinil -pioglitazone -rifabutin -rifampin -rifapentine -some medicines to treat HIV infection like atazanavir, indinavir, lopinavir, nelfinavir, tipranavir, ritonavir -St. John's wort -warfarin This list may not describe all possible interactions. Give your health care provider a list of all the medicines, herbs, non-prescription drugs, or dietary supplements you use. Also tell them if you smoke, drink alcohol, or use illegal drugs. Some items may interact with your medicine. What should I watch for while using this medicine? Visit your doctor or health care professional for regular check ups. See your doctor if you or your partner has sexual contact with others, becomes HIV positive, or gets a sexual transmitted disease. This product does not protect you against HIV infection (AIDS) or other sexually transmitted diseases. You can check the placement of the IUD yourself by reaching up to the top of your vagina with clean fingers to feel the threads. Do not pull on the threads. It is a good habit to check placement after each menstrual period. Call your doctor right away if you feel more of the IUD than just the threads or if you cannot feel the threads at all. The IUD may come out by itself. You may become pregnant if the device comes out. If you notice that the IUD has come out use a backup birth control method like condoms and call your health care provider. Using tampons will not change the position of the IUD and are okay to use during your period. What side effects may I notice from receiving this medicine?   Side effects that you should report to your doctor or health care professional as soon as possible: -allergic reactions like skin rash, itching or  hives, swelling of the face, lips, or tongue -fever, flu-like symptoms -genital sores -high blood pressure -no menstrual period for 6 weeks during use -pain, swelling, warmth in the leg -pelvic pain or tenderness -severe or sudden headache -signs of pregnancy -stomach cramping -sudden shortness of breath -trouble with balance, talking, or walking -unusual vaginal bleeding, discharge -yellowing of the eyes or skin Side effects that usually do not require medical attention (report to your doctor or health care professional if they continue or are bothersome): -acne -breast pain -change in sex drive or performance -changes in weight -cramping, dizziness, or faintness while the device is being inserted -headache -irregular menstrual bleeding within first 3 to 6 months of use -nausea This list may not describe all possible side effects. Call your doctor for medical advice about side effects. You may report side effects to FDA at 1-800-FDA-1088. Where should I keep my medicine? This does not apply. NOTE: This sheet is a summary. It may not cover all possible information. If you have questions about this medicine, talk to your doctor, pharmacist, or health care provider.    2016, Elsevier/Gold Standard. (2012-01-08 13:54:04)  

## 2016-10-29 NOTE — Progress Notes (Signed)
   Patient is a 25 year old who presented to the office today to discuss going back on the Mirena IUD. [facility] the [facility] but stated she was having heavy periods and cramping and she was then placed on a progesterone only oral contraceptive pill. Review of patient's record indicated that she has a history of Leiden 5 factor (heterozygous) and for this reason she cannot be on any progress containing estrogen.  We went through a lengthy discussion of risks benefits and pros and cons of the Mirena IUD. She still has one more week for her oral contraceptive pill and she'll return back at that time which she's menstruation to place a Mirena IUD. Literature information was provided. Patient refuses flu vaccine today.  Greater than 90% time was spent counseling [place] care for this patient. Total time of consultation 10 minutes

## 2016-11-07 ENCOUNTER — Ambulatory Visit (INDEPENDENT_AMBULATORY_CARE_PROVIDER_SITE_OTHER): Payer: BLUE CROSS/BLUE SHIELD | Admitting: Gynecology

## 2016-11-07 ENCOUNTER — Encounter: Payer: Self-pay | Admitting: Gynecology

## 2016-11-07 VITALS — BP 124/80

## 2016-11-07 DIAGNOSIS — Z30430 Encounter for insertion of intrauterine contraceptive device: Secondary | ICD-10-CM | POA: Diagnosis not present

## 2016-11-07 DIAGNOSIS — Z975 Presence of (intrauterine) contraceptive device: Secondary | ICD-10-CM | POA: Insufficient documentation

## 2016-11-07 NOTE — Progress Notes (Signed)
   Patient is a 25 year old who presented to the office for placement of Mirena IUD. She was seen in the office on November 8 for counseling on contraception my note read as follows:  "Patient is a 25 year old who presented to the office today to discuss going back on the Mirena IUD. [facility] the [facility] but stated she was having heavy periods and cramping and she was then placed on a progesterone only oral contraceptive pill. Review of patient's record indicated that she has a history of Leiden 5 factor (heterozygous) and for this reason she cannot be on any progress containing estrogen.  We went through a lengthy discussion of risks benefits and pros and cons of the Mirena IUD. She still has one more week for her oral contraceptive pill and she'll return back at that time which she's menstruation to place a Mirena IUD. Literature information was provided."  Patient currently menstruating                                                                    IUD procedure note       Patient presented to the office today for placement of Mirena IUD. The patient had previously been provided with literature information on this method of contraception. The risks benefits and pros and cons were discussed and all her questions were answered. She is fully aware that this form of contraception is 99% effective and is good for 5 years.  Pelvic exam: Bartholin urethra Skene glands: Within normal limits Vagina: No lesions or discharge, menstrual blood Cervix: No lesions or discharge Uterus: Anteverted position Adnexa: No masses or tenderness Rectal exam: Not done  The cervix was cleansed with Betadine solution. A single-tooth tenaculum was placed on the anterior cervical lip. The uterus sounded to 6 centimeter. The IUD was shown to the patient and inserted in a sterile fashion. The IUD string was trimmed. The single-tooth tenaculum was removed. Patient was instructed to return back to the  office in one month for follow up.

## 2016-11-07 NOTE — Patient Instructions (Signed)

## 2016-11-10 ENCOUNTER — Encounter: Payer: Self-pay | Admitting: Anesthesiology

## 2016-12-04 ENCOUNTER — Ambulatory Visit (INDEPENDENT_AMBULATORY_CARE_PROVIDER_SITE_OTHER): Payer: BLUE CROSS/BLUE SHIELD | Admitting: Gynecology

## 2016-12-04 ENCOUNTER — Encounter: Payer: Self-pay | Admitting: Gynecology

## 2016-12-04 VITALS — BP 126/82

## 2016-12-04 DIAGNOSIS — Z30431 Encounter for routine checking of intrauterine contraceptive device: Secondary | ICD-10-CM

## 2016-12-04 NOTE — Progress Notes (Signed)
   Patient is a 25 year old who presented to the office for her 1 month follow-up after having placed the Mirena IUD. Patient with past history of Leiden 5 factor (heterozygous) and for this reason she cannot be on any estrogen containing products. Patient is doing well since the IUD was placed with no complaints. She had declined the flu vaccine.  Exam: Abdomen: Soft nontender no rebound or guarding Pelvic: Bartholin urethra Skene was within normal limits Vagina: No lesions or discharge Cervix: IUD visualized Uterus: Anteverted normal size shape and consistency Adnexa: No palpable masses or tenderness Rectal exam not done  Assessment/plan: Patient one month status post placement Mirena IUD doing well scheduled to return back to the office in February 2018 for annual exam or when necessary.

## 2017-03-22 ENCOUNTER — Other Ambulatory Visit: Payer: Self-pay | Admitting: Women's Health

## 2017-03-22 DIAGNOSIS — Z30011 Encounter for initial prescription of contraceptive pills: Secondary | ICD-10-CM

## 2017-05-06 ENCOUNTER — Encounter: Payer: Self-pay | Admitting: Gynecology

## 2021-12-25 DIAGNOSIS — Z01419 Encounter for gynecological examination (general) (routine) without abnormal findings: Secondary | ICD-10-CM | POA: Diagnosis not present

## 2022-02-14 ENCOUNTER — Other Ambulatory Visit: Payer: Self-pay

## 2022-02-14 ENCOUNTER — Ambulatory Visit (INDEPENDENT_AMBULATORY_CARE_PROVIDER_SITE_OTHER): Payer: 59 | Admitting: Registered Nurse

## 2022-02-14 ENCOUNTER — Encounter: Payer: Self-pay | Admitting: Registered Nurse

## 2022-02-14 VITALS — BP 128/94 | HR 80 | Temp 98.0°F | Resp 18 | Ht 62.0 in | Wt 215.2 lb

## 2022-02-14 DIAGNOSIS — R21 Rash and other nonspecific skin eruption: Secondary | ICD-10-CM

## 2022-02-14 DIAGNOSIS — Z23 Encounter for immunization: Secondary | ICD-10-CM

## 2022-02-14 MED ORDER — TRIAMCINOLONE ACETONIDE 0.1 % EX CREA: 1.0000 "application " | TOPICAL_CREAM | Freq: Two times a day (BID) | CUTANEOUS | 0 refills | Status: AC

## 2022-02-14 NOTE — Patient Instructions (Signed)
Ms. Kaytie Ratcliffe to meet you!  Call if you need anything  Use triamcinolone twice daily as needed on rash  See you in 5-6 mo for a physical and labs  Thanks,  Rich

## 2022-02-14 NOTE — Progress Notes (Signed)
New Patient Office Visit  Subjective:  Patient ID: Kaylee Collins, female    DOB: May 29, 1991  Age: 31 y.o. MRN: [id number]  CC:  Chief Complaint  Patient presents with   New Patient (Initial Visit)    Patient states she is here to establish care.and discuss rash on the back of patients right leg     HPI Kaylee Collins presents for rash  On and off since early January. Suspected at first to be stress reaction - has been planning her wedding. Red, flat, spots. Do not seem to be fluid filled Comes and goes in same spot, behind R knee No sloughing, drainage, spreading No contacts with rash.  Otherwise no symptoms or concerns  Due for tdap booster. Willing to have this today.    Past Medical History:  Diagnosis Date   Heterozygous factor V Leiden mutation [facility])     Past Surgical History:  Procedure Laterality Date   MOUTH SURGERY      Family History  Problem Relation Age of Onset   Diabetes Father    Diabetes Maternal Grandmother    Multiple sclerosis Maternal Grandfather    Breast cancer Paternal Aunt     Social History   Socioeconomic History   Marital status: Single    Spouse name: Not on file   Number of children: Not on file   Years of education: Not on file   Highest education level: Not on file  Occupational History   Occupation: Dance movement psychotherapist - [employer]  Tobacco Use   Smoking status: Former   Smokeless tobacco: Never  Scientific laboratory technician Use: Never used  Substance and Sexual Activity   Alcohol use: Yes    Alcohol/week: 0.0 standard drinks    Comment: occ   Drug use: No   Sexual activity: Yes  Other Topics Concern   Not on file  Social History Narrative   Not on file   Social Determinants of Health   Financial Resource Strain: Not on file  Food Insecurity: Not on file  Transportation Needs: Not on file  Physical Activity: Not on file  Stress: Not on file  Social Connections: Not on file  Intimate Partner Violence: Not on file     ROS Review of Systems Per hpi   Objective:   Today's Vitals: BP (!) 128/94    Pulse 80    Temp 98 F (36.7 C) (Temporal)    Resp 18    Ht 5\' 2"  (1.575 m)    Wt 215 lb 3.2 oz (97.6 kg)    SpO2 100%    BMI 39.36 kg/m   Physical Exam Vitals and nursing note reviewed.  Constitutional:      General: She is not in acute distress.    Appearance: Normal appearance. She is not ill-appearing, toxic-appearing or diaphoretic.  Cardiovascular:     Rate and Rhythm: Normal rate and regular rhythm.     Pulses: Normal pulses.     Heart sounds: Normal heart sounds. No murmur heard.   No friction rub. No gallop.  Pulmonary:     Effort: Pulmonary effort is normal. No respiratory distress.     Breath sounds: Normal breath sounds. No stridor. No wheezing, rhonchi or rales.  Chest:     Chest wall: No tenderness.  Skin:    General: Skin is warm and dry.     Capillary Refill: Capillary refill takes less than 2 seconds.  Neurological:     General: No focal deficit  present.     Mental Status: She is alert and oriented to person, place, and time. Mental status is at baseline.  Psychiatric:        Mood and Affect: Mood normal.        Behavior: Behavior normal.        Thought Content: Thought content normal.        Judgment: Judgment normal.    Assessment & Plan:   Problem List Items Addressed This Visit   None Visit Diagnoses     Need for diphtheria-tetanus-pertussis (Tdap) vaccine    -  Primary   Relevant Orders   Tdap vaccine greater than or equal to 7yo IM   Rash and nonspecific skin eruption       Relevant Medications   triamcinolone cream (KENALOG) 0.1 %       Outpatient Encounter Medications as of 02/14/2022  Medication Sig   ibuprofen (ADVIL,MOTRIN) 800 MG tablet Take 1 tablet (800 mg total) by mouth every 8 (eight) hours as needed.   triamcinolone cream (KENALOG) 0.1 % Apply 1 application topically 2 (two) times daily.   [DISCONTINUED] fluconazole (DIFLUCAN) 150 MG tablet  Take 1 tablet (150 mg total) by mouth once. (Patient not taking: Reported on 12/04/2016)   [DISCONTINUED] IUD'S IU 1 application by Intrauterine route once. Reported on 02/28/2016 (Patient not taking: Reported on 02/14/2022)   [DISCONTINUED] megestrol (MEGACE) 40 MG tablet Take 1 tablet (40 mg total) by mouth 2 (two) times daily. (Patient not taking: Reported on 12/04/2016)   [DISCONTINUED] norethindrone (ORTHO MICRONOR) 0.35 MG tablet Take 1 tablet (0.35 mg total) by mouth daily. (Patient not taking: Reported on 12/04/2016)   No facility-administered encounter medications on file as of 02/14/2022.    Follow-up: Return in about 5 months (around 07/14/2022) for CPE and labs.   PLAN Rash appears inflammatory. 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 Will give triamcinolone. Discussed nonpharm relief. Return for CPE and labs at appropriate interval Patient encouraged to call clinic with any questions, comments, or concerns. Tdap not given  Maximiano Coss, NP

## 2022-04-23 DIAGNOSIS — L709 Acne, unspecified: Secondary | ICD-10-CM | POA: Diagnosis not present

## 2022-04-23 DIAGNOSIS — R61 Generalized hyperhidrosis: Secondary | ICD-10-CM | POA: Diagnosis not present

## 2022-07-18 ENCOUNTER — Encounter: Payer: 59 | Admitting: Registered Nurse

## 2022-07-21 ENCOUNTER — Ambulatory Visit (INDEPENDENT_AMBULATORY_CARE_PROVIDER_SITE_OTHER): Payer: 59 | Admitting: Registered Nurse

## 2022-07-21 ENCOUNTER — Other Ambulatory Visit: Payer: Self-pay

## 2022-07-21 ENCOUNTER — Encounter: Payer: Self-pay | Admitting: Registered Nurse

## 2022-07-21 VITALS — BP 124/72 | HR 80 | Temp 98.2°F | Resp 18 | Ht 62.0 in | Wt 216.0 lb

## 2022-07-21 DIAGNOSIS — Z130 Encounter for screening for diseases of the blood and blood-forming organs and certain disorders involving the immune mechanism: Secondary | ICD-10-CM | POA: Diagnosis not present

## 2022-07-21 DIAGNOSIS — Z1329 Encounter for screening for other suspected endocrine disorder: Secondary | ICD-10-CM | POA: Diagnosis not present

## 2022-07-21 DIAGNOSIS — Z1159 Encounter for screening for other viral diseases: Secondary | ICD-10-CM

## 2022-07-21 DIAGNOSIS — Z13228 Encounter for screening for other metabolic disorders: Secondary | ICD-10-CM | POA: Diagnosis not present

## 2022-07-21 DIAGNOSIS — Z0000 Encounter for general adult medical examination without abnormal findings: Secondary | ICD-10-CM

## 2022-07-21 DIAGNOSIS — Z13220 Encounter for screening for lipoid disorders: Secondary | ICD-10-CM | POA: Diagnosis not present

## 2022-07-21 DIAGNOSIS — Z23 Encounter for immunization: Secondary | ICD-10-CM | POA: Diagnosis not present

## 2022-07-21 LAB — CBC WITH DIFFERENTIAL/PLATELET
Basophils Absolute: 0.1 K/uL (ref 0.0–0.1)
Basophils Relative: 0.8 % (ref 0.0–3.0)
Eosinophils Absolute: 0.1 K/uL (ref 0.0–0.7)
Eosinophils Relative: 1.3 % (ref 0.0–5.0)
HCT: 40.2 % (ref 36.0–46.0)
Hemoglobin: 13.6 g/dL (ref 12.0–15.0)
Lymphocytes Relative: 39.4 % (ref 12.0–46.0)
Lymphs Abs: 2.7 K/uL (ref 0.7–4.0)
MCHC: 34 g/dL (ref 30.0–36.0)
MCV: 89 fl (ref 78.0–100.0)
Monocytes Absolute: 0.4 K/uL (ref 0.1–1.0)
Monocytes Relative: 5.4 % (ref 3.0–12.0)
Neutro Abs: 3.7 K/uL (ref 1.4–7.7)
Neutrophils Relative %: 53.1 % (ref 43.0–77.0)
Platelets: 254 K/uL (ref 150.0–400.0)
RBC: 4.52 Mil/uL (ref 3.87–5.11)
RDW: 12.1 % (ref 11.5–15.5)
WBC: 7 K/uL (ref 4.0–10.5)

## 2022-07-21 LAB — TSH: TSH: 1.64 u[IU]/mL (ref 0.35–5.50)

## 2022-07-21 LAB — HEMOGLOBIN A1C: Hgb A1c MFr Bld: 5.7 % (ref 4.6–6.5)

## 2022-07-21 NOTE — Patient Instructions (Addendum)
Ms. Brittin Belnap to see you!  Stay well.  I'll let you know how labs look  I recommend these providers:  Jarold Motto, PA Jacquiline Doe, MD Glenetta Hew, MD Letta Moynahan Early, NP Jiles Prows, DNP Abbe Amsterdam, MD   Thank you for letting me take part in your care,  Rich

## 2022-07-21 NOTE — Progress Notes (Signed)
Complete physical exam  Patient: Kaylee Collins   DOB: 1991/04/09   30 y.o. Female  MRN: [id number] Visit Date: 07/21/2022  Subjective:    Chief Complaint  Patient presents with   Annual Exam    Patient states she is here for a CPE    Kaylee Collins is a 31 y.o. female who presents today for a complete physical exam. She reports consuming a general diet.     She generally feels well. She reports sleeping well. She does not have additional problems to discuss today.   Vision:Within the last year Dental:Within Last 6 months STD Screen:No PSA:No Most recent fall risk assessment:    07/21/2022    7:56 AM  Fall Risk   Falls in the past year? 0  Number falls in past yr: 0  Injury with Fall? 0  Risk for fall due to : No Fall Risks  Follow up Falls evaluation completed     Most recent depression screenings:    07/21/2022    7:56 AM  PHQ 2/9 Scores  PHQ - 2 Score 0  PHQ- 9 Score 0     Patient Active Problem List   Diagnosis Date Noted   IUD (intrauterine device) in place 11/07/2016   [place] 12/13/2015   Overweight 12/13/2015   Dysmenorrhea 11/15/2014   Menorrhagia with regular cycle 11/15/2014   Weight gain 01/25/2013   Family history of diabetes mellitus 01/25/2013   Heterozygous factor V Leiden mutation (HCC) 01/25/2013   Past Medical History:  Diagnosis Date   Heterozygous factor V Leiden mutation (HCC)    Past Surgical History:  Procedure Laterality Date   MOUTH SURGERY     Social History   Tobacco Use   Smoking status: Former   Smokeless tobacco: Never  Vaping Use   Vaping Use: Never used  Substance Use Topics   Alcohol use: Yes    Alcohol/week: 0.0 standard drinks of alcohol    Comment: occ   Drug use: No   Social History   Socioeconomic History   Marital status: Single    Spouse name: Not on file   Number of children: Not on file   Years of education: Not on file   Highest education level: Not on file  Occupational History    Occupation: Social research officer, government - [employer]  Tobacco Use   Smoking status: Former   Smokeless tobacco: Never  Building services engineer Use: Never used  Substance and Sexual Activity   Alcohol use: Yes    Alcohol/week: 0.0 standard drinks of alcohol    Comment: occ   Drug use: No   Sexual activity: Yes  Other Topics Concern   Not on file  Social History Narrative   Not on file   Social Determinants of Health   Financial Resource Strain: Not on file  Food Insecurity: Not on file  Transportation Needs: Not on file  Physical Activity: Not on file  Stress: Not on file  Social Connections: Not on file  Intimate Partner Violence: Not on file   Family Status  Relation Name Status   Father  (Not Specified)   MGM  (Not Specified)   MGF  (Not Specified)   [employer]  (Not Specified)   Family History  Problem Relation Age of Onset   Diabetes Father    Diabetes Maternal Grandmother    Multiple sclerosis Maternal Grandfather    Breast cancer Paternal Aunt    Allergies  Allergen Reactions   Meloxicam  Itching     Patient Care Team: Janeece Agee, NP as PCP - General (Adult Health Nurse Practitioner)   Medications: Outpatient Medications Prior to Visit  Medication Sig   ibuprofen (ADVIL,MOTRIN) 800 MG tablet Take 1 tablet (800 mg total) by mouth every 8 (eight) hours as needed.   triamcinolone cream (KENALOG) 0.1 % Apply 1 application topically 2 (two) times daily. (Patient not taking: Reported on 07/21/2022)   No facility-administered medications prior to visit.    Review of Systems  Constitutional: Negative.   HENT: Negative.    Eyes: Negative.   Respiratory: Negative.    Cardiovascular: Negative.   Gastrointestinal: Negative.   Genitourinary: Negative.   Musculoskeletal: Negative.   Skin: Negative.   Neurological: Negative.   Psychiatric/Behavioral: Negative.    All other systems reviewed and are negative.   Last CBC Lab Results  Component Value Date   WBC 5.8  11/22/2014   HGB 13.4 11/22/2014   HCT 38.6 11/22/2014   MCV 84.1 11/22/2014   MCH 29.2 11/22/2014   RDW 12.3 11/22/2014   PLT 265 11/22/2014   Last metabolic panel Lab Results  Component Value Date   GLUCOSE 97 11/22/2014   NA 136 11/22/2014   K 4.2 11/22/2014   CL 107 11/22/2014   CO2 25 11/22/2014   BUN 16 11/22/2014   CREATININE 0.70 11/22/2014   CALCIUM 9.1 11/22/2014   PROT 6.8 11/22/2014   ALBUMIN 3.9 11/22/2014   BILITOT 0.5 11/22/2014   ALKPHOS 58 11/22/2014   AST 15 11/22/2014   ALT 12 11/22/2014   Last lipids Lab Results  Component Value Date   CHOL 145 11/22/2014   HDL 44 11/22/2014   LDLCALC 86 11/22/2014   TRIG 73 11/22/2014   CHOLHDL 3.3 11/22/2014   Last hemoglobin A1c Lab Results  Component Value Date   HGBA1C 5.5 01/26/2013   Last thyroid functions Lab Results  Component Value Date   TSH 1.186 07/09/2015   T4TOTAL 6.8 11/22/2014   Last vitamin D No results found for: "25OHVITD2", "25OHVITD3", "VD25OH" Last vitamin B12 and Folate No results found for: "VITAMINB12", "FOLATE"      Objective:     BP 124/72   Pulse 80   Temp 98.2 F (36.8 C) (Temporal)   Resp 18   Ht 5\' 2"  (1.575 m)   Wt 216 lb (98 kg)   SpO2 99%   BMI 39.51 kg/m   BP Readings from Last 3 Encounters:  07/21/22 124/72  02/14/22 (!) 128/94  12/04/16 126/82   Wt Readings from Last 3 Encounters:  07/21/22 216 lb (98 kg)  02/14/22 215 lb 3.2 oz (97.6 kg)  12/13/15 194 lb (88 kg)   SpO2 Readings from Last 3 Encounters:  07/21/22 99%  02/14/22 100%  05/21/15 100%      Physical Exam Vitals and nursing note reviewed.  Constitutional:      General: She is not in acute distress.    Appearance: Normal appearance. She is obese. She is not ill-appearing, toxic-appearing or diaphoretic.  HENT:     Head: Normocephalic and atraumatic.     Right Ear: Tympanic membrane, ear canal and external ear normal. There is no impacted cerumen.     Left Ear: Tympanic membrane,  ear canal and external ear normal. There is no impacted cerumen.     Nose: Nose normal. No congestion or rhinorrhea.     Mouth/Throat:     Mouth: Mucous membranes are moist.     Pharynx: Oropharynx is  clear. No oropharyngeal exudate or posterior oropharyngeal erythema.  Eyes:     General: No scleral icterus.       Right eye: No discharge.        Left eye: No discharge.     Extraocular Movements: Extraocular movements intact.     Conjunctiva/sclera: Conjunctivae normal.     Pupils: Pupils are equal, round, and reactive to light.  Neck:     Vascular: No carotid bruit.  Cardiovascular:     Rate and Rhythm: Normal rate and regular rhythm.     Pulses: Normal pulses.     Heart sounds: Normal heart sounds. No murmur heard.    No friction rub. No gallop.  Pulmonary:     Effort: Pulmonary effort is normal. No respiratory distress.     Breath sounds: Normal breath sounds. No stridor. No wheezing, rhonchi or rales.  Chest:     Chest wall: No tenderness.  Abdominal:     General: Abdomen is flat. Bowel sounds are normal. There is no distension.     Palpations: There is no mass.     Tenderness: There is no abdominal tenderness. There is no right CVA tenderness, left CVA tenderness, guarding or rebound.     Hernia: No hernia is present.  Musculoskeletal:        General: No swelling, tenderness, deformity or signs of injury. Normal range of motion.     Cervical back: Normal range of motion and neck supple. No rigidity or tenderness.     Right lower leg: No edema.     Left lower leg: No edema.  Lymphadenopathy:     Cervical: No cervical adenopathy.  Skin:    General: Skin is warm and dry.     Capillary Refill: Capillary refill takes less than 2 seconds.     Coloration: Skin is not jaundiced or pale.     Findings: No bruising, erythema, lesion or rash.  Neurological:     General: No focal deficit present.     Mental Status: She is alert and oriented to person, place, and time. Mental status  is at baseline.     Cranial Nerves: No cranial nerve deficit.     Sensory: No sensory deficit.     Motor: No weakness.     Coordination: Coordination normal.     Gait: Gait normal.     Deep Tendon Reflexes: Reflexes normal.  Psychiatric:        Mood and Affect: Mood normal.        Behavior: Behavior normal.        Thought Content: Thought content normal.        Judgment: Judgment normal.      No results found for any visits on 07/21/22.    Assessment & Plan:    Routine Health Maintenance and Physical Exam  Immunization History  Administered Date(s) Administered   HPV Quadrivalent 08/28/2005, 12/02/2005, 07/23/2006    Health Maintenance  Topic Date Due   Hepatitis C Screening  Never done   TETANUS/TDAP  Never done   PAP SMEAR-Modifier  07/21/2022 (Originally 01/26/2016)   COVID-19 Vaccine (1) 08/06/2022 (Originally 05/25/1992)   INFLUENZA VACCINE  07/22/2022   HPV VACCINES  Completed   HIV Screening  Completed    Discussed health benefits of physical activity, and encouraged her to engage in regular exercise appropriate for her age and condition.  Problem List Items Addressed This Visit   None Visit Diagnoses     Annual physical exam    -  Primary   Screening for endocrine, metabolic and immunity disorder       Relevant Orders   CBC with Differential/Platelet   Comprehensive metabolic panel   Hemoglobin A1c   TSH   Lipid screening       Relevant Orders   Lipid panel   Need for diphtheria-tetanus-pertussis (Tdap) vaccine       Relevant Orders   Tdap vaccine greater than or equal to 7yo IM   Encounter for screening for other viral diseases       Relevant Orders   Hepatitis C antibody      Return in about 1 year (around 07/22/2023) for CPE and labs.     PLAN Exam unremarkable Labs collected. Will follow up with the patient as warranted.. Patient encouraged to call clinic with any questions, comments, or concerns.   Janeece Agee, NP

## 2022-07-22 LAB — COMPREHENSIVE METABOLIC PANEL
ALT: 17 U/L (ref 0–35)
AST: 17 U/L (ref 0–37)
Albumin: 4.3 g/dL (ref 3.5–5.2)
Alkaline Phosphatase: 42 U/L (ref 39–117)
BUN: 16 mg/dL (ref 6–23)
CO2: 27 mEq/L (ref 19–32)
Calcium: 9.4 mg/dL (ref 8.4–10.5)
Chloride: 105 mEq/L (ref 96–112)
Creatinine, Ser: 0.68 mg/dL (ref 0.40–1.20)
GFR: 116.68 mL/min (ref >60.00)
Glucose, Bld: 97 mg/dL (ref 70–99)
Potassium: 3.8 mEq/L (ref 3.5–5.1)
Sodium: 139 mEq/L (ref 135–145)
Total Bilirubin: 0.6 mg/dL (ref 0.2–1.2)
Total Protein: 7.5 g/dL (ref 6.0–8.3)

## 2022-07-22 LAB — HEPATITIS C ANTIBODY: Hepatitis C Ab: NONREACTIVE

## 2022-07-22 LAB — LIPID PANEL
Cholesterol: 170 mg/dL (ref 0–200)
HDL: 53.3 mg/dL (ref >39.00)
LDL Cholesterol: 89 mg/dL (ref 0–99)
NonHDL: 116.87
Total CHOL/HDL Ratio: 3
Triglycerides: 139 mg/dL (ref 0.0–149.0)
VLDL: 27.8 mg/dL (ref 0.0–40.0)
